# Patient Record
Sex: Male | Born: 1940 | Race: White | Hispanic: No | Marital: Married | Smoking: Former smoker
Health system: Southern US, Community
[De-identification: ages and names within clinical notes are randomized; demographics above are authoritative.]

## PROBLEM LIST (undated history)

## (undated) DIAGNOSIS — G4730 Sleep apnea, unspecified: Secondary | ICD-10-CM

## (undated) DIAGNOSIS — J45909 Unspecified asthma, uncomplicated: Secondary | ICD-10-CM

## (undated) DIAGNOSIS — E119 Type 2 diabetes mellitus without complications: Secondary | ICD-10-CM

## (undated) DIAGNOSIS — J449 Chronic obstructive pulmonary disease, unspecified: Secondary | ICD-10-CM

## (undated) HISTORY — PX: REPLACEMENT TOTAL KNEE BILATERAL: SUR1225

## (undated) HISTORY — PX: OTHER SURGICAL HISTORY: SHX169

## (undated) HISTORY — PX: VASECTOMY: SHX75

## (undated) HISTORY — PX: BACK SURGERY: SHX140

## (undated) HISTORY — PX: KNEE SURGERY: SHX244

## (undated) HISTORY — PX: TONSILLECTOMY: SUR1361

---

## 1998-04-24 ENCOUNTER — Ambulatory Visit (HOSPITAL_BASED_OUTPATIENT_CLINIC_OR_DEPARTMENT_OTHER): Admission: RE | Admit: 1998-04-24 | Discharge: 1998-04-24 | Payer: Self-pay | Admitting: Orthopaedic Surgery

## 1999-06-28 ENCOUNTER — Emergency Department (HOSPITAL_COMMUNITY): Admission: EM | Admit: 1999-06-28 | Discharge: 1999-06-28 | Payer: Self-pay | Admitting: Internal Medicine

## 1999-06-28 ENCOUNTER — Encounter: Payer: Self-pay | Admitting: Internal Medicine

## 1999-07-01 ENCOUNTER — Encounter: Admission: RE | Admit: 1999-07-01 | Discharge: 1999-09-29 | Payer: Self-pay | Admitting: Family Medicine

## 1999-08-28 ENCOUNTER — Encounter: Admission: RE | Admit: 1999-08-28 | Discharge: 1999-11-26 | Payer: Self-pay | Admitting: Internal Medicine

## 1999-12-16 ENCOUNTER — Encounter: Admission: RE | Admit: 1999-12-16 | Discharge: 2000-03-15 | Payer: Self-pay | Admitting: Internal Medicine

## 2000-04-08 ENCOUNTER — Encounter: Admission: RE | Admit: 2000-04-08 | Discharge: 2000-07-07 | Payer: Self-pay | Admitting: Internal Medicine

## 2000-08-19 ENCOUNTER — Emergency Department (HOSPITAL_COMMUNITY): Admission: EM | Admit: 2000-08-19 | Discharge: 2000-08-19 | Payer: Self-pay | Admitting: Emergency Medicine

## 2000-08-19 ENCOUNTER — Encounter: Payer: Self-pay | Admitting: Emergency Medicine

## 2001-08-20 ENCOUNTER — Encounter: Payer: Self-pay | Admitting: Orthopaedic Surgery

## 2001-08-24 ENCOUNTER — Inpatient Hospital Stay (HOSPITAL_COMMUNITY): Admission: RE | Admit: 2001-08-24 | Discharge: 2001-08-28 | Payer: Self-pay | Admitting: Orthopaedic Surgery

## 2001-08-25 ENCOUNTER — Encounter: Payer: Self-pay | Admitting: Internal Medicine

## 2002-02-15 ENCOUNTER — Ambulatory Visit (HOSPITAL_BASED_OUTPATIENT_CLINIC_OR_DEPARTMENT_OTHER): Admission: RE | Admit: 2002-02-15 | Discharge: 2002-02-15 | Payer: Self-pay | Admitting: Family Medicine

## 2002-12-15 ENCOUNTER — Encounter: Payer: Self-pay | Admitting: Vascular Surgery

## 2002-12-19 ENCOUNTER — Ambulatory Visit (HOSPITAL_COMMUNITY): Admission: RE | Admit: 2002-12-19 | Discharge: 2002-12-19 | Payer: Self-pay | Admitting: Vascular Surgery

## 2002-12-19 ENCOUNTER — Encounter (INDEPENDENT_AMBULATORY_CARE_PROVIDER_SITE_OTHER): Payer: Self-pay | Admitting: *Deleted

## 2006-08-31 ENCOUNTER — Ambulatory Visit: Payer: Self-pay | Admitting: Internal Medicine

## 2007-06-26 DIAGNOSIS — IMO0002 Reserved for concepts with insufficient information to code with codable children: Secondary | ICD-10-CM | POA: Insufficient documentation

## 2007-06-26 DIAGNOSIS — E1165 Type 2 diabetes mellitus with hyperglycemia: Secondary | ICD-10-CM

## 2007-06-26 DIAGNOSIS — G4733 Obstructive sleep apnea (adult) (pediatric): Secondary | ICD-10-CM

## 2007-06-26 DIAGNOSIS — J310 Chronic rhinitis: Secondary | ICD-10-CM

## 2007-06-26 DIAGNOSIS — J209 Acute bronchitis, unspecified: Secondary | ICD-10-CM | POA: Insufficient documentation

## 2007-10-15 ENCOUNTER — Encounter: Payer: Self-pay | Admitting: Internal Medicine

## 2007-10-29 ENCOUNTER — Ambulatory Visit: Payer: Self-pay | Admitting: Internal Medicine

## 2008-10-03 ENCOUNTER — Encounter: Admission: RE | Admit: 2008-10-03 | Discharge: 2008-10-03 | Payer: Self-pay | Admitting: Family Medicine

## 2008-11-08 ENCOUNTER — Ambulatory Visit: Payer: Self-pay | Admitting: Internal Medicine

## 2009-10-25 ENCOUNTER — Encounter: Admission: RE | Admit: 2009-10-25 | Discharge: 2009-10-25 | Payer: Self-pay | Admitting: Family Medicine

## 2010-03-07 ENCOUNTER — Inpatient Hospital Stay (HOSPITAL_COMMUNITY): Admission: RE | Admit: 2010-03-07 | Discharge: 2010-03-10 | Payer: Self-pay | Admitting: Orthopaedic Surgery

## 2010-12-01 LAB — HEMOGLOBIN A1C: Hgb A1c MFr Bld: 6.8 % — ABNORMAL HIGH (ref <5.7)

## 2010-12-01 LAB — BASIC METABOLIC PANEL
BUN: 12 mg/dL (ref 6–23)
BUN: 14 mg/dL (ref 6–23)
CO2: 26 mEq/L (ref 19–32)
CO2: 29 mEq/L (ref 19–32)
Calcium: 8.4 mg/dL (ref 8.4–10.5)
Chloride: 101 mEq/L (ref 96–112)
Chloride: 97 mEq/L (ref 96–112)
GFR calc Af Amer: >60 mL/min (ref >60)
GFR calc non Af Amer: >60 mL/min (ref >60)
Glucose, Bld: 165 mg/dL — ABNORMAL HIGH (ref 70–99)
Glucose, Bld: 178 mg/dL — ABNORMAL HIGH (ref 70–99)
Potassium: 4.3 mEq/L (ref 3.5–5.1)
Potassium: 4.6 mEq/L (ref 3.5–5.1)
Sodium: 135 mEq/L (ref 135–145)
Sodium: 137 mEq/L (ref 135–145)

## 2010-12-01 LAB — APTT: aPTT: 25 seconds (ref 24–37)

## 2010-12-01 LAB — CBC
HCT: 36.7 % — ABNORMAL LOW (ref 39.0–52.0)
HCT: 40.1 % (ref 39.0–52.0)
Hemoglobin: 13.6 g/dL (ref 13.0–17.0)
MCH: 33.9 pg (ref 26.0–34.0)
MCH: 34 pg (ref 26.0–34.0)
MCHC: 33.7 g/dL (ref 30.0–36.0)
MCHC: 33.8 g/dL (ref 30.0–36.0)
MCHC: 34.3 g/dL (ref 30.0–36.0)
MCV: 100.1 fL — ABNORMAL HIGH (ref 78.0–100.0)
MCV: 100.4 fL — ABNORMAL HIGH (ref 78.0–100.0)
Platelets: 141 10*3/uL — ABNORMAL LOW (ref 150–400)
Platelets: 156 10*3/uL (ref 150–400)
RBC: 3.99 MIL/uL — ABNORMAL LOW (ref 4.22–5.81)
RDW: 13.1 % (ref 11.5–15.5)
RDW: 13.3 % (ref 11.5–15.5)
RDW: 13.7 % (ref 11.5–15.5)
WBC: 9.3 10*3/uL (ref 4.0–10.5)

## 2010-12-01 LAB — DIFFERENTIAL
Basophils Relative: 1 % (ref 0–1)
Lymphs Abs: 2.3 10*3/uL (ref 0.7–4.0)
Monocytes Absolute: 0.9 10*3/uL (ref 0.1–1.0)
Monocytes Relative: 10 % (ref 3–12)
Neutro Abs: 5 10*3/uL (ref 1.7–7.7)
Neutrophils Relative %: 58 % (ref 43–77)

## 2010-12-01 LAB — GLUCOSE, CAPILLARY
Glucose-Capillary: 162 mg/dL — ABNORMAL HIGH (ref 70–99)
Glucose-Capillary: 183 mg/dL — ABNORMAL HIGH (ref 70–99)
Glucose-Capillary: 185 mg/dL — ABNORMAL HIGH (ref 70–99)
Glucose-Capillary: 202 mg/dL — ABNORMAL HIGH (ref 70–99)
Glucose-Capillary: 209 mg/dL — ABNORMAL HIGH (ref 70–99)
Glucose-Capillary: 225 mg/dL — ABNORMAL HIGH (ref 70–99)
Glucose-Capillary: 261 mg/dL — ABNORMAL HIGH (ref 70–99)

## 2010-12-01 LAB — URINALYSIS, ROUTINE W REFLEX MICROSCOPIC
Leukocytes, UA: NEGATIVE
Nitrite: NEGATIVE
Specific Gravity, Urine: 1.014 (ref 1.005–1.030)
Urobilinogen, UA: 0.2 mg/dL (ref 0.0–1.0)
pH: 5.5 (ref 5.0–8.0)

## 2010-12-01 LAB — SURGICAL PCR SCREEN
MRSA, PCR: NEGATIVE
Staphylococcus aureus: NEGATIVE

## 2010-12-01 LAB — URINE MICROSCOPIC-ADD ON

## 2010-12-01 LAB — COMPREHENSIVE METABOLIC PANEL
ALT: 52 U/L (ref 0–53)
Albumin: 4 g/dL (ref 3.5–5.2)
Alkaline Phosphatase: 81 U/L (ref 39–117)
Calcium: 9.7 mg/dL (ref 8.4–10.5)
Glucose, Bld: 83 mg/dL (ref 70–99)
Potassium: 4.2 mEq/L (ref 3.5–5.1)
Sodium: 137 mEq/L (ref 135–145)
Total Protein: 8.3 g/dL (ref 6.0–8.3)

## 2010-12-01 LAB — PROTIME-INR
INR: 0.95 (ref 0.00–1.49)
INR: 1.09 (ref 0.00–1.49)
INR: 2.06 — ABNORMAL HIGH (ref 0.00–1.49)
Prothrombin Time: 23 seconds — ABNORMAL HIGH (ref 11.6–15.2)

## 2011-01-31 NOTE — Op Note (Signed)
[place]. [facility]  Patient:    Joe Palmer, Joe Palmer Visit Number: [id number] MRN: [id number]          Service Type: SUR Location: [id number] Attending Physician:  Marcene Corning Dictated by:   Lubertha Basque. Jerl Santos, M.D. Proc. Date: 08/24/01 Admit Date:  08/24/2001                             Operative Report  PREOPERATIVE DIAGNOSIS:  Left knee degenerative joint disease.  POSTOPERATIVE DIAGNOSIS:  Left knee degenerative joint disease.  OPERATION PERFORMED:  Left knee total knee replacement.  ANESTHESIA:  General.  ATTENDING SURGEON:  Lubertha Basque. Jerl Santos, M.D.  ASSISTANT: 1. Jearld Adjutant, M.D. 2. Lindwood Qua, P.A.  INDICATIONS FOR PROCEDURE:  The patient is a 70 year old man with a long history of bilateral knee pain.  On the left he has end stage degenerative arthritis on x-ray.  He has failed various oral anti-inflammatories as well as injections of cortisone and Synvisc.  He has disabling pain and was offered a knee replacement operation.  The procedure was discussed with the patient and informed operative consent was obtained after discussion of possible complications of reaction to anesthesia, infection, DVT, PE, and death.  DESCRIPTION OF PROCEDURE:  The patient was taken to an operating suite where general anesthetic was applied without difficulty.  He was then positioned supine and prepped and draped in normal sterile fashion.  After the administration of preop intravenous antibiotics, the left leg was elevated, exsanguinated, tourniquet inflated about the thigh.  All appropriate anti-infective measures were used including intravenous antibiotics, Betadine impregnated drapes, and closed hooded exhaust systems for each member of the surgical team.  A longitudinal incision was made on the anterior aspect of the knee with dissection down to the extensor mechanism.  A medial parapatellar incision was made in this structure.  The knee  cap was flipped and the knee flexed.  He had severe degenerative arthritis in all three compartments. There was no ACL, but the PCL was intact.  He had fair bone quality.  A tibial cut was made with an extramedullary guide parallel with the floor.  The femur sized to a size large component and the appropriate guide was placed and used to make anterior and posterior cuts creating a flexion gap of 10 mm.  He was left slightly loose.  A release was done on the medial aspect where he had some tight structures.  The anterior cut on the femur did leave a slight notch in the [facility].  An extension guide was then placed in the femur and this was utilized to make a distal cut creating a 10 mm balanced extension gap.  The Chamfer cutting guide was placed on the femur and used to create the large size large Chamfer cuts.  The tibia sized to a size 5 component and the appropriate guide was placed and used to prepare for a keeled component. Patella sized to a large and appropriate guide was placed and cut this down to thickness of 27 to 15 mm and then the appropriate drill holes were made.  A trial reduction was performed with all the aforementioned components and he easily came to slight hyperextension and flexed past 120 with a patella that tracked well.  All trial components were removed.  Pulsatile lavage was used to cleanse all bony surfaces followed by mixing the cement with antibiotic. The cement was then pressurized on all  three cut bony surfaces and the Depuy LCS components were placed.  Again these were a size large femur, large patella and 5 tibia with a 10 mm deep dish spacer.  Pressure was held on all the components until the cement had hardened.  Excess cement was trimmed.  The knee again ranged showing the patella tracked well without a lateral release. The tourniquet was deflated and a mild amount of bleeding was easily controlled with Bovie cautery.  A drain was placed exiting  superolaterally. The extensor mechanism was prepared with #1 Vicryl in interrupted fashion. The subcutaneous tissues were reapproximated with 0 and 2-0 undyed Vicryl followed by skin closure with staples.  Adaptic was applied to the wound followed by dry gauze and a loose Ace wrap.  Estimated blood loss and intraoperative fluids can be obtained from Anesthesia records as can accurate tourniquet time which was just over an hour.  DISPOSITION:  The patient was extubated in the operating room and taken to the recovery room in stable condition.  Plans were for him to be admitted to the orthopedic surgery service for appropriate postoperative care to include perioperative antibiotics and Coumadin for deep vein thrombosis prophylaxis. Dictated by:   Lubertha Basque Jerl Santos, M.D. Attending Physician:  Marcene Corning DD:  08/24/01 TD:  08/24/01 Job: [id number]

## 2011-01-31 NOTE — Assessment & Plan Note (Signed)
[facility]                             PULMONARY OFFICE NOTE   NAME:Bass, JAREL CUADRA                     MRN:          [id number]  DATE:08/31/2006                            DOB:          01-08-1941    PROBLEM:  1. Obstructive sleep apnea.  2. Asthmatic bronchitis.  3. Rhinitis.  4. Diabetes.   HISTORY:  First visit at this office for Mr. Dusza, previously followed  at [facility].  He has continued CPAP at 12 CWP,  used every night, but old machine is worn and needs replacement.  Humidifier also is insufficient on the older machine.  He is having  increased nasal congestion treated with a decongestant nasal spray and  also Nasacort AQ.   MEDICATIONS:  1. Metformin 860 mg.  2. Januvia 100 mg.  3. Glipizide XL 10 mg.  4. Advair 100/50.  5. Nasacort AQ.  6. Tramadol.  7. Doxycycline before dental work.   ALLERGIES:  NO MEDICATION ALLERGY.   OBJECTIVE:  Weight 279 pounds, compared with 276 pounds in 2005.  Blood  pressure 136/90, pulse regular 75, room air saturation 94%.  He is obese, alert, husky voice quality without stridor or pharyngeal  erythema.  There is end expiratory wheeze, unlabored.  Heart sounds regular without murmur or gallop, no edema.   IMPRESSION:  Obstructive apnea.  Pressure setting is probably okay, but  the machine is worn and needs replacement.  His chronic asthmatic  bronchitis may not be optimally controlled, but he implies that current  meds are adequate.   PLAN:  1. Nasal saline gel, replacement CPAP set at 12 CWP.  2. Medication talk done.  Options considered.  3. Weight loss encouraged.  4. Sleep hygiene reviewed.  5. Schedule return in 1 year, earlier p.r.n.     Clinton D. Maple Hudson, MD, Tonny Bollman, FACP  Electronically Signed    CDY/MedQ  DD: 09/04/2006  DT: 09/05/2006  Job #: [id number]   cc:   Gretta Arab. Valentina Lucks, M.D.

## 2011-01-31 NOTE — Op Note (Signed)
NAME:  Joe Palmer, Joe Palmer                        ACCOUNT NO.:  [id number]   MEDICAL RECORD NO.:  [id number]                   PATIENT TYPE:  OIB   LOCATION:  2899                                 FACILITY:  [facility]   PHYSICIAN:  Larina Earthly, M.D.                 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   PROCEDURE:  Ligation and stripping of the right lesser saphenous vein and  removal of tributary varicosities with stab-avulsion technique.   SURGEON:  Larina Earthly, M.D.   ANESTHESIA:  General endotracheal.   COMPLICATIONS:  None.   DISPOSITION:  To recovery room stable.   DESCRIPTION OF PROCEDURE:  The patient was taken to the operating room and  placed in the supine position, where the area of the right leg was prepped  and draped in the usual sterile fashion.  Using an incision at the level of  the ankle, the lesser saphenous vein was identified at the ankle.  The  patient's lesser saphenous and tributary varicosities had been marked  preoperatively with the patient standing.  The lesser saphenous vein was  ligated at the level of the ankle and was controlled proximally.  The vein  was opened with an 11 blade and a stripper was passed up to the level of the  popliteal space.  A separate incision was made at the level of the popliteal  space and the lesser saphenous vein was identified as it entered the  popliteal fossa.  The vein was ligated proximally and was controlled  distally with a 2-0 silk Potts tie.  The vein was open at this level, and  the stripper head was passed out.  Next with several separate stab  incisions, the remaining tributary varicosities in the popliteal space  and  the posterior calf were removed with stab-avulsion technique.  Hemostasis  was obtained with pressure.  The lesser saphenous vein was then stripped  from the popliteal space to the ankle and pressure again was held for  hemostasis.  The wounds were irrigated with saline.  The wounds were closed  with 4-0 Vicryl in the subcuticular tissue.  Benzoin and Steri-Strips were  applied.  The patient's leg was dressed with Kerlix and 4 x 4's over the  incisions and a Coban pressure dressing was placed.  The patient tolerated  the procedure without immediate complication and was transferred to the  holding area in stable condition.  Larina Earthly, M.D.    TFE/MEDQ  D:  12/19/2002  T:  12/19/2002  Job:  [id number]

## 2011-01-31 NOTE — Discharge Summary (Signed)
[place]. [facility]  Patient:    Joe Palmer, Joe Palmer Visit Number: [id number] MRN: [id number]          Service Type: SUR Location: 5000 5033 01 Attending Physician:  Marcene Corning Dictated by:   Prince Rome, P.A. Admit Date:  08/24/2001 Discharge Date: 08/28/2001                             Discharge Summary  ADMISSION DIAGNOSES: 1. 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 2. Status post lumbar laminectomy. 3. Non-insulin-dependent diabetes mellitus. 4. Tobacco abuse. 5. Obstructive chronic bronchitis.  DISCHARGE DIAGNOSES: 1. 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 2. Status post lumbar laminectomy. 3. Non-insulin-dependent diabetes mellitus. 4. Tobacco abuse. 5. Obstructive chronic bronchitis.  PROCEDURES:  Total knee replacement left.  BRIEF HISTORY:  The patient is a patient well known to [place].  He is 70 years old.  He has had a knee arthroscopy and now is having pain with every step with his left knee and trouble sleeping at night time.  We have tried Syndisc injections, nonsteroidal anti-inflammatory drugs and corticosteroid injections.  We have discussed treatment options with the patient those being total knee replacement.  PERTINENT LABORATORY AND X-RAY FINDINGS: ELECTROCARDIOGRAM:  Sinus tachycardia.  CHEST X-RAY:  Chronic bronchitis.  INR last testing was 3.1.  CBC:  Hemoglobin 11.4.  OTHER INDICES:  Essentially normal and are inclusive in this chart.  HOSPITAL COURSE:  The patient was admitted postoperatively and placed on a variety of p.o. and IM analgesics for pain, intravenous Ancef 1 gram q.8h, PCA pump for pain control.  Dressings and drains were pulled the second day postoperatively.  Physical therapy was ordered for weight-bearing as tolerated.  He was having a difficult time with wheezing and we had started some incentive spirometry and then later moved to some nebulizer treatments. Then his  family doctor had a hospitalist that also held a consultation on the patient while he was in the hospital.  He was on low dose Coumadin, low dose protocol.  Physical therapy was helping with this and regulating to prevent deep venous thrombosis.  The patient progressed well with physical therapy to be weight-bearing as tolerated and was discharged home.  CONDITION ON DISCHARGE:  Improved.  FOLLOW UP:  Patient will remain on Coumadin for four weeks postoperatively and was given a prescription for pain medication. Patient has arrangements for home therapy and blood draws for his protime as he is on Coumadin were arranged.  The patient will return to the office within seven to ten days. Dictated by:   Prince Rome, P.A. Attending Physician:  Marcene Corning DD:  09/27/01 TD:  09/27/01 Job: [id number]

## 2011-03-24 ENCOUNTER — Other Ambulatory Visit: Payer: Self-pay | Admitting: Dermatology

## 2011-09-29 ENCOUNTER — Other Ambulatory Visit: Payer: Self-pay | Admitting: Dermatology

## 2012-04-05 ENCOUNTER — Other Ambulatory Visit: Payer: Self-pay | Admitting: Dermatology

## 2013-06-07 ENCOUNTER — Ambulatory Visit
Admission: RE | Admit: 2013-06-07 | Discharge: 2013-06-07 | Disposition: A | Discharge status: Home / Self Care | Payer: Medicare Other | Source: Ambulatory Visit | Attending: Family Medicine | Admitting: Family Medicine

## 2013-06-07 ENCOUNTER — Other Ambulatory Visit: Payer: Self-pay | Admitting: Family Medicine

## 2013-06-07 DIAGNOSIS — R0989 Other specified symptoms and signs involving the circulatory and respiratory systems: Secondary | ICD-10-CM

## 2013-06-07 DIAGNOSIS — R0600 Dyspnea, unspecified: Secondary | ICD-10-CM

## 2013-12-05 ENCOUNTER — Emergency Department (HOSPITAL_COMMUNITY): Payer: Medicare Other

## 2013-12-05 ENCOUNTER — Encounter (HOSPITAL_COMMUNITY): Payer: Self-pay | Admitting: Emergency Medicine

## 2013-12-05 ENCOUNTER — Inpatient Hospital Stay (HOSPITAL_COMMUNITY)
Admission: EM | Admit: 2013-12-05 | Discharge: 2013-12-08 | DRG: 190 | Disposition: A | Discharge status: Home / Self Care | Payer: Medicare Other | Attending: Internal Medicine | Admitting: Internal Medicine

## 2013-12-05 DIAGNOSIS — J209 Acute bronchitis, unspecified: Secondary | ICD-10-CM

## 2013-12-05 DIAGNOSIS — IMO0002 Reserved for concepts with insufficient information to code with codable children: Secondary | ICD-10-CM | POA: Diagnosis present

## 2013-12-05 DIAGNOSIS — Z6841 Body Mass Index (BMI) 40.0 and over, adult: Secondary | ICD-10-CM

## 2013-12-05 DIAGNOSIS — R0902 Hypoxemia: Secondary | ICD-10-CM | POA: Diagnosis present

## 2013-12-05 DIAGNOSIS — G4733 Obstructive sleep apnea (adult) (pediatric): Secondary | ICD-10-CM

## 2013-12-05 DIAGNOSIS — Z96659 Presence of unspecified artificial knee joint: Secondary | ICD-10-CM

## 2013-12-05 DIAGNOSIS — Z91199 Patient's noncompliance with other medical treatment and regimen due to unspecified reason: Secondary | ICD-10-CM

## 2013-12-05 DIAGNOSIS — J45901 Unspecified asthma with (acute) exacerbation: Principal | ICD-10-CM

## 2013-12-05 DIAGNOSIS — J9600 Acute respiratory failure, unspecified whether with hypoxia or hypercapnia: Secondary | ICD-10-CM | POA: Diagnosis present

## 2013-12-05 DIAGNOSIS — J449 Chronic obstructive pulmonary disease, unspecified: Secondary | ICD-10-CM | POA: Insufficient documentation

## 2013-12-05 DIAGNOSIS — Z79899 Other long term (current) drug therapy: Secondary | ICD-10-CM

## 2013-12-05 DIAGNOSIS — IMO0001 Reserved for inherently not codable concepts without codable children: Secondary | ICD-10-CM | POA: Diagnosis present

## 2013-12-05 DIAGNOSIS — Z87891 Personal history of nicotine dependence: Secondary | ICD-10-CM

## 2013-12-05 DIAGNOSIS — Z9119 Patient's noncompliance with other medical treatment and regimen: Secondary | ICD-10-CM

## 2013-12-05 DIAGNOSIS — J441 Chronic obstructive pulmonary disease with (acute) exacerbation: Principal | ICD-10-CM | POA: Diagnosis present

## 2013-12-05 DIAGNOSIS — Z882 Allergy status to sulfonamides status: Secondary | ICD-10-CM

## 2013-12-05 DIAGNOSIS — J310 Chronic rhinitis: Secondary | ICD-10-CM

## 2013-12-05 DIAGNOSIS — E1165 Type 2 diabetes mellitus with hyperglycemia: Secondary | ICD-10-CM | POA: Diagnosis present

## 2013-12-05 HISTORY — DX: Type 2 diabetes mellitus without complications: E11.9

## 2013-12-05 HISTORY — DX: Unspecified asthma, uncomplicated: J45.909

## 2013-12-05 HISTORY — DX: Sleep apnea, unspecified: G47.30

## 2013-12-05 HISTORY — DX: Chronic obstructive pulmonary disease, unspecified: J44.9

## 2013-12-05 LAB — I-STAT TROPONIN, ED: TROPONIN I, POC: 0 ng/mL (ref 0.00–0.08)

## 2013-12-05 LAB — COMPREHENSIVE METABOLIC PANEL
ALBUMIN: 3.5 g/dL (ref 3.5–5.2)
ALK PHOS: 110 U/L (ref 39–117)
ALT: 23 U/L (ref 0–53)
AST: 25 U/L (ref 0–37)
BILIRUBIN TOTAL: 0.4 mg/dL (ref 0.3–1.2)
BUN: 23 mg/dL (ref 6–23)
CHLORIDE: 101 meq/L (ref 96–112)
CO2: 25 mEq/L (ref 19–32)
Calcium: 9.4 mg/dL (ref 8.4–10.5)
Creatinine, Ser: 0.85 mg/dL (ref 0.50–1.35)
GFR calc non Af Amer: 83 mL/min — ABNORMAL LOW (ref >90)
GLUCOSE: 170 mg/dL — AB (ref 70–99)
POTASSIUM: 4.2 meq/L (ref 3.7–5.3)
SODIUM: 140 meq/L (ref 137–147)
TOTAL PROTEIN: 7.9 g/dL (ref 6.0–8.3)

## 2013-12-05 LAB — CREATININE, SERUM
Creatinine, Ser: 0.91 mg/dL (ref 0.50–1.35)
GFR calc Af Amer: >90 mL/min (ref >90)
GFR calc non Af Amer: 81 mL/min — ABNORMAL LOW (ref >90)

## 2013-12-05 LAB — CBC
HEMATOCRIT: 45.2 % (ref 39.0–52.0)
HEMOGLOBIN: 15.8 g/dL (ref 13.0–17.0)
MCH: 34.1 pg — AB (ref 26.0–34.0)
MCHC: 35 g/dL (ref 30.0–36.0)
MCV: 97.6 fL (ref 78.0–100.0)
Platelets: 143 10*3/uL — ABNORMAL LOW (ref 150–400)
RBC: 4.63 MIL/uL (ref 4.22–5.81)
RDW: 13.7 % (ref 11.5–15.5)
WBC: 9.1 10*3/uL (ref 4.0–10.5)

## 2013-12-05 LAB — I-STAT ARTERIAL BLOOD GAS, ED
BICARBONATE: 25.2 meq/L — AB (ref 20.0–24.0)
O2 SAT: 93 %
PH ART: 7.385 (ref 7.350–7.450)
Patient temperature: 98.6
TCO2: 26 mmol/L (ref 0–100)
pCO2 arterial: 42.1 mmHg (ref 35.0–45.0)
pO2, Arterial: 68 mmHg — ABNORMAL LOW (ref 80.0–100.0)

## 2013-12-05 LAB — CBC WITH DIFFERENTIAL/PLATELET
Basophils Absolute: 0 10*3/uL (ref 0.0–0.1)
Basophils Relative: 0 % (ref 0–1)
EOS ABS: 0.5 10*3/uL (ref 0.0–0.7)
Eosinophils Relative: 5 % (ref 0–5)
HCT: 46.8 % (ref 39.0–52.0)
HEMOGLOBIN: 16 g/dL (ref 13.0–17.0)
LYMPHS ABS: 2.1 10*3/uL (ref 0.7–4.0)
Lymphocytes Relative: 24 % (ref 12–46)
MCH: 33.6 pg (ref 26.0–34.0)
MCHC: 34.2 g/dL (ref 30.0–36.0)
MCV: 98.3 fL (ref 78.0–100.0)
MONOS PCT: 8 % (ref 3–12)
Monocytes Absolute: 0.7 10*3/uL (ref 0.1–1.0)
NEUTROS ABS: 5.7 10*3/uL (ref 1.7–7.7)
NEUTROS PCT: 63 % (ref 43–77)
PLATELETS: 154 10*3/uL (ref 150–400)
RBC: 4.76 MIL/uL (ref 4.22–5.81)
RDW: 13.6 % (ref 11.5–15.5)
WBC: 9 10*3/uL (ref 4.0–10.5)

## 2013-12-05 LAB — D-DIMER, QUANTITATIVE: D-Dimer, Quant: 1.16 ug/mL-FEU — ABNORMAL HIGH (ref 0.00–0.48)

## 2013-12-05 LAB — PRO B NATRIURETIC PEPTIDE: Pro B Natriuretic peptide (BNP): 44.8 pg/mL (ref 0–450)

## 2013-12-05 MED ORDER — ALBUTEROL SULFATE (2.5 MG/3ML) 0.083% IN NEBU
5.0000 mg | INHALATION_SOLUTION | RESPIRATORY_TRACT | Status: DC
  Administered 2013-12-05 – 2013-12-06 (×4): 5 mg via RESPIRATORY_TRACT
  Filled 2013-12-05 (×3): qty 6

## 2013-12-05 MED ORDER — METHYLPREDNISOLONE SODIUM SUCC 125 MG IJ SOLR
125.0000 mg | Freq: Once | INTRAMUSCULAR | Status: AC
  Administered 2013-12-05: 125 mg via INTRAVENOUS
  Filled 2013-12-05: qty 2

## 2013-12-05 MED ORDER — ALBUTEROL SULFATE (2.5 MG/3ML) 0.083% IN NEBU: 5.0000 mg | INHALATION_SOLUTION | RESPIRATORY_TRACT | Status: DC | PRN

## 2013-12-05 MED ORDER — SODIUM CHLORIDE 0.9 % IV SOLN
INTRAVENOUS | Status: DC
  Administered 2013-12-05: 22:00:00 via INTRAVENOUS

## 2013-12-05 MED ORDER — TIOTROPIUM BROMIDE MONOHYDRATE 18 MCG IN CAPS
1.0000 | ORAL_CAPSULE | Freq: Every day | RESPIRATORY_TRACT | Status: DC
  Administered 2013-12-06: 18 ug via RESPIRATORY_TRACT
  Filled 2013-12-05: qty 5

## 2013-12-05 MED ORDER — HEPARIN SODIUM (PORCINE) 5000 UNIT/ML IJ SOLN
5000.0000 [IU] | Freq: Three times a day (TID) | INTRAMUSCULAR | Status: DC
  Administered 2013-12-05 – 2013-12-08 (×8): 5000 [IU] via SUBCUTANEOUS
  Filled 2013-12-05 (×11): qty 1

## 2013-12-05 MED ORDER — OXYCODONE-ACETAMINOPHEN 10-325 MG PO TABS: 1.0000 | ORAL_TABLET | Freq: Four times a day (QID) | ORAL | Status: DC | PRN

## 2013-12-05 MED ORDER — LORATADINE 10 MG PO TABS
10.0000 mg | ORAL_TABLET | Freq: Every day | ORAL | Status: DC
  Administered 2013-12-06 – 2013-12-08 (×3): 10 mg via ORAL
  Filled 2013-12-05 (×3): qty 1

## 2013-12-05 MED ORDER — DOXYCYCLINE HYCLATE 100 MG IV SOLR
100.0000 mg | Freq: Two times a day (BID) | INTRAVENOUS | Status: DC
  Administered 2013-12-05 – 2013-12-08 (×6): 100 mg via INTRAVENOUS
  Filled 2013-12-05 (×8): qty 100

## 2013-12-05 MED ORDER — INSULIN ASPART 100 UNIT/ML ~~LOC~~ SOLN
0.0000 [IU] | SUBCUTANEOUS | Status: DC
  Administered 2013-12-06 (×3): 8 [IU] via SUBCUTANEOUS
  Administered 2013-12-06: 15 [IU] via SUBCUTANEOUS
  Administered 2013-12-06 (×2): 11 [IU] via SUBCUTANEOUS
  Administered 2013-12-07: 8 [IU] via SUBCUTANEOUS
  Administered 2013-12-07: 15 [IU] via SUBCUTANEOUS
  Administered 2013-12-07: 3 [IU] via SUBCUTANEOUS

## 2013-12-05 MED ORDER — ALBUTEROL (5 MG/ML) CONTINUOUS INHALATION SOLN
10.0000 mg/h | INHALATION_SOLUTION | Freq: Once | RESPIRATORY_TRACT | Status: AC
  Administered 2013-12-05: 10 mg/h via RESPIRATORY_TRACT
  Filled 2013-12-05: qty 20

## 2013-12-05 MED ORDER — INSULIN GLARGINE 100 UNIT/ML SOLOSTAR PEN: 40.0000 [IU] | PEN_INJECTOR | Freq: Every day | SUBCUTANEOUS | Status: DC

## 2013-12-05 MED ORDER — METHYLPREDNISOLONE SODIUM SUCC 125 MG IJ SOLR
125.0000 mg | Freq: Four times a day (QID) | INTRAMUSCULAR | Status: DC
  Administered 2013-12-05 – 2013-12-06 (×3): 125 mg via INTRAVENOUS
  Filled 2013-12-05 (×6): qty 2

## 2013-12-05 NOTE — H&P (Signed)
Hospitalist Admission History and Physical  Patient name: Joe Palmer Medical record number: [id number] Date of birth: 06-30-1941 Age: 73 y.o. Gender: male  Primary Care Provider: Astrid DivineGRIFFIN,ELAINE COLLINS, MD  Chief Complaint: resp distress, COPD exacerbation  History of Present Illness:This is a 73 y.o. year old male with significant past medical history of COPD/asthma, OSA presenting with resp distress, hypoxia, COPD exacerbation. Has had worsening allergic sxs over past 3-4 days. Has had progressive wheezing and increased WOB. No cough or productive sputum. No fever or chills. No chest pain.  Was seen by PCP early today. Noted to be persistent hypoxic into mid 80s. Was given neb x1 with O2 sats going into upper 80s/low 90s. Was brought to ER 2/2 persistent resp distress and hypoxia.  In ER, CXR negative for PNA. Trop and pro BNP also WNL. Started on CAT as well as solumedrol, still with O2 sats in low 90s/upper 80s despite supplemental O2. Denies previous O2 use in the past.     Patient Active Problem List   Diagnosis Date Noted  . Decompensated COPD with exacerbation (chronic obstructive pulmonary disease) 12/05/2013  . DM 06/26/2007  . OBSTRUCTIVE SLEEP APNEA 06/26/2007  . ASTHMATIC BRONCHITIS, ACUTE 06/26/2007  . RHINITIS 06/26/2007   Past Medical History: Past Medical History  Diagnosis Date  . Diabetes mellitus without complication   . Asthma   . Sleep apnea   . COPD (chronic obstructive pulmonary disease)     Past Surgical History: Past Surgical History  Procedure Laterality Date  . Tonsillectomy    . Vasectomy    . Back surgery    . Cataract surgery    . Knee surgery    . Replacement total knee bilateral      Social History: History   Social History  . Marital Status: Married    Spouse Name: N/A    Number of Children: N/A  . Years of Education: N/A   Social History Main Topics  . Smoking status: Former Smoker    Types: Cigarettes    Quit date:  09/15/2008  . Smokeless tobacco: None  . Alcohol Use: Yes     Comment: socially  . Drug Use: None  . Sexual Activity: None   Other Topics Concern  . None   Social History Narrative  . None    Family History: History reviewed. No pertinent family history.  Allergies: Allergies  Allergen Reactions  . Sulfa Antibiotics Other (See Comments)    unknown    Current Facility-Administered Medications  Medication Dose Route Frequency Provider Last Rate Last Dose  . 0.9 %  sodium chloride infusion   Intravenous Continuous Doree AlbeeSteven Newton, MD      . albuterol (PROVENTIL) (2.5 MG/3ML) 0.083% nebulizer solution 5 mg  5 mg Nebulization Q2H Doree AlbeeSteven Newton, MD      . albuterol (PROVENTIL) (2.5 MG/3ML) 0.083% nebulizer solution 5 mg  5 mg Nebulization Q2H PRN Doree AlbeeSteven Newton, MD      . doxycycline (VIBRAMYCIN) 100 mg in dextrose 5 % 250 mL IVPB  100 mg Intravenous Q12H Doree AlbeeSteven Newton, MD      . heparin injection 5,000 Units  5,000 Units Subcutaneous 3 times per day Doree AlbeeSteven Newton, MD      . insulin aspart (novoLOG) injection 0-15 Units  0-15 Units Subcutaneous 6 times per day Doree AlbeeSteven Newton, MD      . Insulin Glargine (LANTUS) Solostar Pen 40 Units  40 Units Subcutaneous QHS Doree AlbeeSteven Newton, MD      . loratadine [facility](CLARITIN)  tablet 10 mg  10 mg Oral Daily Doree Albee, MD      . methylPREDNISolone sodium succinate (SOLU-MEDROL) 125 mg/2 mL injection 125 mg  125 mg Intravenous Q6H Doree Albee, MD      . oxyCODONE-acetaminophen (PERCOCET) 10-325 MG per tablet 1 tablet  1 tablet Oral Q6H PRN Doree Albee, MD      . tiotropium [facility]) inhalation capsule 18 mcg  1 capsule Inhalation Daily Doree Albee, MD       Current Outpatient Prescriptions  Medication Sig Dispense Refill  . albuterol (PROVENTIL HFA;VENTOLIN HFA) 108 (90 BASE) MCG/ACT inhaler Inhale 2 puffs into the lungs every 6 (six) hours as needed for wheezing or shortness of breath.      Marland Kitchen albuterol (PROVENTIL) (2.5 MG/3ML) 0.083% nebulizer  solution Inhale 3 mLs into the lungs every 6 (six) hours as needed for wheezing or shortness of breath.       . cetirizine (ZYRTEC) 10 MG tablet Take 10 mg by mouth daily as needed for allergies.      . fluticasone (FLONASE) 50 MCG/ACT nasal spray Place 1 spray into both nostrils daily.      Marland Kitchen GLIPIZIDE XL 10 MG 24 hr tablet Take 10 mg by mouth daily.      Marland Kitchen ibuprofen (ADVIL,MOTRIN) 200 MG tablet Take 800 mg by mouth every 6 (six) hours as needed for moderate pain.      Marland Kitchen JANUVIA 100 MG tablet Take 100 mg by mouth daily.      Marland Kitchen LANTUS SOLOSTAR 100 UNIT/ML Solostar Pen Inject 40 Units into the skin at bedtime.      . metFORMIN (GLUCOPHAGE) 850 MG tablet Take 850-1,700 mg by mouth 2 (two) times daily. Takes 1 tab in am, then 2 tabs throughout the rest of the day      . oxyCODONE-acetaminophen (PERCOCET) 10-325 MG per tablet Take 1 tablet by mouth every 6 (six) hours as needed for pain.       Marland Kitchen SPIRIVA HANDIHALER 18 MCG inhalation capsule Place 1 capsule into inhaler and inhale daily.       Review Of Systems: 12 point ROS negative except as noted above in HPI.  Physical Exam: Filed Vitals:   12/05/13 2030  BP: 138/62  Pulse: 90  Temp:   Resp: 18    General: alert, cooperative and moderately obese HEENT: PERRLA and extra ocular movement intact Heart: S1, S2 normal, no murmur, rub or gallop, regular rate and rhythm Lungs: + diffuse inspiratory, expiratory wheezes  Abdomen: abdomen is soft without significant tenderness, masses, organomegaly or guarding Extremities: extremities normal, atraumatic, no cyanosis or edema Skin:no rashes, no ecchymoses Neurology: normal without focal findings and mental status, speech normal, alert and oriented x3  Labs and Imaging: Lab Results  Component Value Date/Time   NA 140 12/05/2013  6:29 PM   K 4.2 12/05/2013  6:29 PM   CL 101 12/05/2013  6:29 PM   CO2 25 12/05/2013  6:29 PM   BUN 23 12/05/2013  6:29 PM   CREATININE 0.85 12/05/2013  6:29 PM   GLUCOSE  170* 12/05/2013  6:29 PM   Lab Results  Component Value Date   WBC 9.0 12/05/2013   HGB 16.0 12/05/2013   HCT 46.8 12/05/2013   MCV 98.3 12/05/2013   PLT 154 12/05/2013    Dg Chest 2 View  12/05/2013   CLINICAL DATA:  Shortness of breath.  EXAM: CHEST  2 VIEW  COMPARISON:  DG CHEST 2 VIEW dated 06/07/2013; Varney Biles  CHEST 2 VIEW dated 03/05/2010  FINDINGS: Elevated right hemidiaphragm. Heart size within normal limits. Thoracic spondylosis. The lungs appear clear.  IMPRESSION: 1. Elevated right hemidiaphragm. 2. Thoracic spondylosis.   Electronically Signed   By: Herbie Baltimore M.D.   On: 12/05/2013 19:51     Assessment and Plan: Joe Palmer is a 73 y.o. year old male presenting with hypoxia, COPD exacerbation   Hypoxia: likely 2/2 COPD exacerbation. No chest pain currently. Wells score 1.5. Check d dimer. Start on solumedrol q6, albuterol nebs q1q2. Non rebreather.  Continue spiriva. Consider chest CT if hypoxia persists. ? If hypoxia is acute on chronic process. Check ABG to correlate. Bicarb WNL.   DM: SSI. A1C.  OSA: restart CPAP.  FEN/GI: Carb modified diet.  Prophylaxis: sub q heparin  Disposition: pending further evaluation  Code Status:Full Code       Doree Albee MD  Pager: [phone]

## 2013-12-05 NOTE — ED Notes (Signed)
Pt from home with c/o shortness of breath.  Pt sts he has been feeling sick since this Friday.  Pt went to his doctor's office today and was noted to be 84% on RA.  Pt given nebulizer treatment at doctor's office and O2 sats rose to 89% on RA.  Pt currently 88% on RA in triage.  Pt with hx of COPD and asthma; does not wear O2 at home.

## 2013-12-05 NOTE — ED Notes (Signed)
Pt removed from continuous neb and O2 dropped into the upper 80s. Placed back on 2 L nasal cannula. EDP aware. O2 91% on 2 L.

## 2013-12-05 NOTE — ED Provider Notes (Signed)
CSN: [id number]     Arrival date & time 12/05/13  1750 History   First MD Initiated Contact with Patient 12/05/13 1815     Chief Complaint  Patient presents with  . Shortness of Breath     (Consider location/radiation/quality/duration/timing/severity/associated sxs/prior Treatment) Patient is a 73 y.o. male presenting with shortness of breath. The history is provided by the patient and the spouse.  Shortness of Breath  patient here complaining of shortness of breath with nonproductive cough x3 days. Does have a history of COPD he went to his doctor's office today and pulse oximetry was 84% on room air. He was given an albuterol treatment and his O2 sat rose to 89%. Patient denies any fever or chills. No vomiting or diarrhea. No anginal type chest pain. No pleuritic chest pain. Denies any lower extremity swelling. Does not use oxygen at home. Still feels short of breath at this time.  Past Medical History  Diagnosis Date  . Diabetes mellitus without complication   . Asthma   . Sleep apnea   . COPD (chronic obstructive pulmonary disease)    Past Surgical History  Procedure Laterality Date  . Tonsillectomy    . Vasectomy    . Back surgery    . Cataract surgery    . Knee surgery    . Replacement total knee bilateral     History reviewed. No pertinent family history. History  Substance Use Topics  . Smoking status: Former Smoker    Types: Cigarettes    Quit date: 09/15/2008  . Smokeless tobacco: Not on file  . Alcohol Use: Yes     Comment: socially    Review of Systems  Respiratory: Positive for shortness of breath.   All other systems reviewed and are negative.      Allergies  Sulfa antibiotics  Home Medications   Current Outpatient Rx  Name  Route  Sig  Dispense  Refill  . albuterol (PROVENTIL HFA;VENTOLIN HFA) 108 (90 BASE) MCG/ACT inhaler   Inhalation   Inhale 2 puffs into the lungs every 6 (six) hours as needed for wheezing or shortness of breath.          Marland Kitchen albuterol (PROVENTIL) (2.5 MG/3ML) 0.083% nebulizer solution   Inhalation   Inhale 3 mLs into the lungs every 6 (six) hours as needed for wheezing or shortness of breath.          . cetirizine (ZYRTEC) 10 MG tablet   Oral   Take 10 mg by mouth daily as needed for allergies.         . fluticasone (FLONASE) 50 MCG/ACT nasal spray   Each Nare   Place 1 spray into both nostrils daily.         Marland Kitchen GLIPIZIDE XL 10 MG 24 hr tablet   Oral   Take 10 mg by mouth daily.         Marland Kitchen ibuprofen (ADVIL,MOTRIN) 200 MG tablet   Oral   Take 800 mg by mouth every 6 (six) hours as needed for moderate pain.         Marland Kitchen JANUVIA 100 MG tablet   Oral   Take 100 mg by mouth daily.         Marland Kitchen LANTUS SOLOSTAR 100 UNIT/ML Solostar Pen   Subcutaneous   Inject 40 Units into the skin at bedtime.         . metFORMIN (GLUCOPHAGE) 850 MG tablet   Oral   Take 850-1,700 mg by mouth 2 (two)  times daily. Takes 1 tab in am, then 2 tabs throughout the rest of the day         . oxyCODONE-acetaminophen (PERCOCET) 10-325 MG per tablet   Oral   Take 1 tablet by mouth every 6 (six) hours as needed for pain.          Marland Kitchen. SPIRIVA HANDIHALER 18 MCG inhalation capsule   Inhalation   Place 1 capsule into inhaler and inhale daily.          BP 151/80  Pulse 84  Temp(Src) 98.1 F (36.7 C) (Oral)  Resp 18  SpO2 91% Physical Exam  Nursing note and vitals reviewed. Constitutional: He is oriented to person, place, and time. He appears well-developed and well-nourished.  Non-toxic appearance. No distress.  HENT:  Head: Normocephalic and atraumatic.  Eyes: Conjunctivae, EOM and lids are normal. Pupils are equal, round, and reactive to light.  Neck: Normal range of motion. Neck supple. No tracheal deviation present. No mass present.  Cardiovascular: Normal rate, regular rhythm and normal heart sounds.  Exam reveals no gallop.   No murmur heard. Pulmonary/Chest: Effort normal. No stridor. No respiratory  distress. He has decreased breath sounds. He has wheezes. He has no rhonchi. He has no rales.  Abdominal: Soft. Normal appearance and bowel sounds are normal. He exhibits no distension. There is no tenderness. There is no rebound and no CVA tenderness.  Musculoskeletal: Normal range of motion. He exhibits no edema and no tenderness.  Neurological: He is alert and oriented to person, place, and time. He has normal strength. No cranial nerve deficit or sensory deficit. GCS eye subscore is 4. GCS verbal subscore is 5. GCS motor subscore is 6.  Skin: Skin is warm and dry. No abrasion and no rash noted.  Psychiatric: He has a normal mood and affect. His speech is normal and behavior is normal.    ED Course  Procedures (including critical care time) Labs Review Labs Reviewed  CBC WITH DIFFERENTIAL  COMPREHENSIVE METABOLIC PANEL  PRO B NATRIURETIC PEPTIDE  I-STAT TROPOININ, ED   Imaging Review No results found.   EKG Interpretation   Date/Time:  Monday December 05 2013 17:55:07 EDT Ventricular Rate:  87 PR Interval:  162 QRS Duration: 80 QT Interval:  356 QTC Calculation: 428 R Axis:   -39 Text Interpretation:  Sinus rhythm with marked sinus arrhythmia Left axis  deviation Low voltage QRS Cannot rule out Anterior infarct , age  undetermined Abnormal ECG Confirmed by Kayela Humphres  MD, Marigene Erler ([id number]) on  12/05/2013 6:44:49 PM      MDM   Final diagnoses:  None   Patient given albuterol 10 mg over one hour here along with Solu-Medrol IV. Wheezing remains. Pulse oximetry is and hit his exertion. Was placed on 2 L per nasal cannula. He is protecting his airway. He will be admitted for COPD exacerbation.    Toy BakerAnthony T Jonisha Kindig, MD 12/05/13 2117

## 2013-12-05 NOTE — ED Notes (Addendum)
Pts O2 88% on 2 L after getting up and moving from bed to chair. Hospitalist at bedside stated to place pt on NRB. Pt placed on NRB and O2 now 98%. Informed admitting MD of this and he stated that we could wean his O2 down with a target O2 sat of >92%.

## 2013-12-05 NOTE — ED Notes (Signed)
Pt placed on Venti mask and titrated up by O2 sats pt currently on 12 L with an FiO2 of 50% and pts O2 sats >92%.

## 2013-12-06 ENCOUNTER — Inpatient Hospital Stay (HOSPITAL_COMMUNITY): Payer: Medicare Other

## 2013-12-06 DIAGNOSIS — R0902 Hypoxemia: Secondary | ICD-10-CM | POA: Diagnosis present

## 2013-12-06 DIAGNOSIS — J449 Chronic obstructive pulmonary disease, unspecified: Secondary | ICD-10-CM

## 2013-12-06 DIAGNOSIS — J209 Acute bronchitis, unspecified: Secondary | ICD-10-CM

## 2013-12-06 DIAGNOSIS — J441 Chronic obstructive pulmonary disease with (acute) exacerbation: Secondary | ICD-10-CM

## 2013-12-06 DIAGNOSIS — J310 Chronic rhinitis: Secondary | ICD-10-CM

## 2013-12-06 DIAGNOSIS — IMO0001 Reserved for inherently not codable concepts without codable children: Secondary | ICD-10-CM

## 2013-12-06 DIAGNOSIS — Z6841 Body Mass Index (BMI) 40.0 and over, adult: Secondary | ICD-10-CM

## 2013-12-06 DIAGNOSIS — E1165 Type 2 diabetes mellitus with hyperglycemia: Secondary | ICD-10-CM

## 2013-12-06 LAB — CBC WITH DIFFERENTIAL/PLATELET
Basophils Absolute: 0 10*3/uL (ref 0.0–0.1)
Basophils Relative: 0 % (ref 0–1)
EOS ABS: 0 10*3/uL (ref 0.0–0.7)
EOS PCT: 0 % (ref 0–5)
HEMATOCRIT: 44.5 % (ref 39.0–52.0)
Hemoglobin: 15.1 g/dL (ref 13.0–17.0)
Lymphocytes Relative: 8 % — ABNORMAL LOW (ref 12–46)
Lymphs Abs: 0.5 10*3/uL — ABNORMAL LOW (ref 0.7–4.0)
MCH: 33.6 pg (ref 26.0–34.0)
MCHC: 33.9 g/dL (ref 30.0–36.0)
MCV: 98.9 fL (ref 78.0–100.0)
MONO ABS: 0.1 10*3/uL (ref 0.1–1.0)
Monocytes Relative: 1 % — ABNORMAL LOW (ref 3–12)
Neutro Abs: 5.2 10*3/uL (ref 1.7–7.7)
Neutrophils Relative %: 90 % — ABNORMAL HIGH (ref 43–77)
Platelets: 152 10*3/uL (ref 150–400)
RBC: 4.5 MIL/uL (ref 4.22–5.81)
RDW: 13.8 % (ref 11.5–15.5)
WBC: 5.8 10*3/uL (ref 4.0–10.5)

## 2013-12-06 LAB — GLUCOSE, CAPILLARY
GLUCOSE-CAPILLARY: 337 mg/dL — AB (ref 70–99)
GLUCOSE-CAPILLARY: 289 mg/dL — AB (ref 70–99)
Glucose-Capillary: 329 mg/dL — ABNORMAL HIGH (ref 70–99)
Glucose-Capillary: 282 mg/dL — ABNORMAL HIGH (ref 70–99)
Glucose-Capillary: 392 mg/dL — ABNORMAL HIGH (ref 70–99)

## 2013-12-06 LAB — COMPREHENSIVE METABOLIC PANEL
ALK PHOS: 102 U/L (ref 39–117)
ALT: 22 U/L (ref 0–53)
AST: 25 U/L (ref 0–37)
Albumin: 3.1 g/dL — ABNORMAL LOW (ref 3.5–5.2)
BUN: 26 mg/dL — ABNORMAL HIGH (ref 6–23)
CO2: 20 mEq/L (ref 19–32)
Calcium: 9.1 mg/dL (ref 8.4–10.5)
Chloride: 97 mEq/L (ref 96–112)
Creatinine, Ser: 0.91 mg/dL (ref 0.50–1.35)
GFR calc Af Amer: >90 mL/min (ref >90)
GFR calc non Af Amer: 81 mL/min — ABNORMAL LOW (ref >90)
GLUCOSE: 369 mg/dL — AB (ref 70–99)
POTASSIUM: 4.3 meq/L (ref 3.7–5.3)
SODIUM: 138 meq/L (ref 137–147)
TOTAL PROTEIN: 7.5 g/dL (ref 6.0–8.3)
Total Bilirubin: 0.3 mg/dL (ref 0.3–1.2)

## 2013-12-06 LAB — HEMOGLOBIN A1C
Hgb A1c MFr Bld: 8.1 % — ABNORMAL HIGH (ref <5.7)
Mean Plasma Glucose: 186 mg/dL — ABNORMAL HIGH (ref <117)

## 2013-12-06 LAB — MRSA PCR SCREENING: MRSA by PCR: NEGATIVE

## 2013-12-06 MED ORDER — ALBUTEROL SULFATE (2.5 MG/3ML) 0.083% IN NEBU: 2.5000 mg | INHALATION_SOLUTION | RESPIRATORY_TRACT | Status: DC | PRN

## 2013-12-06 MED ORDER — ALBUTEROL SULFATE (2.5 MG/3ML) 0.083% IN NEBU
2.5000 mg | INHALATION_SOLUTION | RESPIRATORY_TRACT | Status: DC
  Administered 2013-12-06: 2.5 mg via RESPIRATORY_TRACT
  Filled 2013-12-06: qty 3

## 2013-12-06 MED ORDER — OXYCODONE HCL 5 MG PO TABS: 5.0000 mg | ORAL_TABLET | Freq: Four times a day (QID) | ORAL | Status: DC | PRN

## 2013-12-06 MED ORDER — IPRATROPIUM-ALBUTEROL 0.5-2.5 (3) MG/3ML IN SOLN
3.0000 mL | Freq: Four times a day (QID) | RESPIRATORY_TRACT | Status: DC
  Administered 2013-12-06 – 2013-12-07 (×3): 3 mL via RESPIRATORY_TRACT
  Filled 2013-12-06 (×3): qty 3

## 2013-12-06 MED ORDER — INSULIN GLARGINE 100 UNIT/ML ~~LOC~~ SOLN
45.0000 [IU] | Freq: Every day | SUBCUTANEOUS | Status: DC
  Administered 2013-12-06: 45 [IU] via SUBCUTANEOUS
  Filled 2013-12-06 (×2): qty 0.45

## 2013-12-06 MED ORDER — ALBUTEROL SULFATE (2.5 MG/3ML) 0.083% IN NEBU
2.5000 mg | INHALATION_SOLUTION | Freq: Three times a day (TID) | RESPIRATORY_TRACT | Status: DC
  Administered 2013-12-06: 2.5 mg via RESPIRATORY_TRACT
  Filled 2013-12-06: qty 3

## 2013-12-06 MED ORDER — INSULIN GLARGINE 100 UNIT/ML ~~LOC~~ SOLN
40.0000 [IU] | Freq: Every day | SUBCUTANEOUS | Status: DC
  Administered 2013-12-06: 40 [IU] via SUBCUTANEOUS
  Filled 2013-12-06 (×3): qty 0.4

## 2013-12-06 MED ORDER — METHYLPREDNISOLONE SODIUM SUCC 40 MG IJ SOLR
40.0000 mg | Freq: Four times a day (QID) | INTRAMUSCULAR | Status: DC
  Administered 2013-12-06 – 2013-12-08 (×8): 40 mg via INTRAVENOUS
  Filled 2013-12-06 (×12): qty 1

## 2013-12-06 MED ORDER — OXYCODONE-ACETAMINOPHEN 5-325 MG PO TABS: 1.0000 | ORAL_TABLET | Freq: Four times a day (QID) | ORAL | Status: DC | PRN

## 2013-12-06 NOTE — Progress Notes (Signed)
Ambulated pt around unit, 1 full circle; initially started out on 6L, pt remained 100 percent O2; decreased pt to 4L and pt held stable between 90% and 97% O2; pt's RR tate ranged between 28-40; pt stated he felt good walking;

## 2013-12-06 NOTE — ED Notes (Signed)
Report to receiving RN on 2H.  Pt to be transported on monitor with RN accompanying.

## 2013-12-06 NOTE — Care Management Note (Addendum)
    Page 1 of 1   12/07/2013     10:18:57 AM   CARE MANAGEMENT NOTE 12/07/2013  Patient:  Joe Palmer,Joe Palmer   Account Number:  [id number]  Date Initiated:  12/06/2013  Documentation initiated by:  Junius CreamerWELL,DEBBIE  Subjective/Objective Assessment:   adm w copd exacerb     Action/Plan:   lives w wife, pcp dr Hope Buddselaine smith   Anticipated DC Date:     Anticipated DC Plan:        DC Planning Services  CM consult      PAC Choice  DURABLE MEDICAL EQUIPMENT   Choice offered to / List presented to:     DME arranged  OXYGEN      DME agency  [facility]        Status of service:   Medicare Important Message given?   (If response is "NO", the following Medicare IM given date fields will be blank) Date Medicare IM given:   Date Additional Medicare IM given:    Discharge Disposition:    Per UR Regulation:  Reviewed for med. necessity/level of care/duration of stay  If discussed at Long Length of Stay Meetings, dates discussed:    Comments:  3/25 1016a debbie Tilford Deaton rn,bsn spoke w pt. no pref to dme agencies. ref to [facility] that may be dc on 3-26. no sure of final liter flow at present. await final orders.

## 2013-12-06 NOTE — Consult Note (Signed)
PULMONARY / CRITICAL CARE MEDICINE   Name: Joe Palmer MRN: [id number] DOB: Apr 03, 1941    ADMISSION DATE:  12/05/2013 CONSULTATION DATE:  12/06/2013  REFERRING MD :  Rito Ehrlich PRIMARY SERVICE: [facility]  CHIEF COMPLAINT:  COPD Exacerbation  BRIEF PATIENT DESCRIPTION: 73 y.o. M w PMH COPD/asthma, OSA, presented 3/23 with AECOPD.  Saw PCP earlier in day, hypoxic in mid 80's, did not resolve after breathing rx, sent to ER for persistent resp distress.  Admitted under [facility]; however, minimal improvement in sx's, therefore PCCM consulted for assistance.  SIGNIFICANT EVENTS / STUDIES:  None  LINES / TUBES: None  CULTURES: None  ANTIBIOTICS: Doxy 3/23 >>>   HISTORY OF PRESENT ILLNESS:  [name] is a 73 y.o. M with PMH significant for COPD/asthma, OSA, DM, who presented with hypoxic respiratory failure on 3/23.  He saw his PCP earlier in the day and was hypoxic with SpO2 in 80s, did not respond to nebulizer Rx, so was sent to ER.  In ER, CXR, Trop, proBNP all WNL.  SpO2 in upper 80's/lower 90's while in ER.  Per pt, symptoms began 3 - 4 days ago and he feels it is because his allergies flared up. Given Solumedrol, Albuterol nebs, Spiriva, and put on NRB. Admitted under [facility].  Following day, pt with little improvement, PCCM consulted for further recs.   PAST MEDICAL HISTORY :  Past Medical History  Diagnosis Date  . Diabetes mellitus without complication   . Asthma   . Sleep apnea   . COPD (chronic obstructive pulmonary disease)    Past Surgical History  Procedure Laterality Date  . Tonsillectomy    . Vasectomy    . Back surgery    . Cataract surgery    . Knee surgery    . Replacement total knee bilateral     Prior to Admission medications   Medication Sig Start Date End Date Taking? Authorizing Provider  albuterol (PROVENTIL HFA;VENTOLIN HFA) 108 (90 BASE) MCG/ACT inhaler Inhale 2 puffs into the lungs every 6 (six) hours as needed for wheezing or shortness of breath.   Yes  Historical Provider, MD  albuterol (PROVENTIL) (2.5 MG/3ML) 0.083% nebulizer solution Inhale 3 mLs into the lungs every 6 (six) hours as needed for wheezing or shortness of breath.  11/04/13  Yes Historical Provider, MD  cetirizine (ZYRTEC) 10 MG tablet Take 10 mg by mouth daily as needed for allergies.   Yes Historical Provider, MD  fluticasone (FLONASE) 50 MCG/ACT nasal spray Place 1 spray into both nostrils daily. 10/05/13  Yes Historical Provider, MD  GLIPIZIDE XL 10 MG 24 hr tablet Take 10 mg by mouth daily. 10/05/13  Yes Historical Provider, MD  ibuprofen (ADVIL,MOTRIN) 200 MG tablet Take 800 mg by mouth every 6 (six) hours as needed for moderate pain.   Yes Historical Provider, MD  JANUVIA 100 MG tablet Take 100 mg by mouth daily. 11/07/13  Yes Historical Provider, MD  LANTUS SOLOSTAR 100 UNIT/ML Solostar Pen Inject 40 Units into the skin at bedtime. 11/30/13  Yes Historical Provider, MD  metFORMIN (GLUCOPHAGE) 850 MG tablet Take 850-1,700 mg by mouth 2 (two) times daily. Takes 1 tab in am, then 2 tabs throughout the rest of the day 10/12/13  Yes Historical Provider, MD  oxyCODONE-acetaminophen (PERCOCET) 10-325 MG per tablet Take 1 tablet by mouth every 6 (six) hours as needed for pain.  11/28/13  Yes Historical Provider, MD  SPIRIVA HANDIHALER 18 MCG inhalation capsule Place 1 capsule into inhaler and inhale daily.  09/19/13  Yes Historical Provider, MD   Allergies  Allergen Reactions  . Sulfa Antibiotics Other (See Comments)    unknown    FAMILY HISTORY:  History reviewed. No pertinent family history. SOCIAL HISTORY:  reports that he quit smoking about 5 years ago. His smoking use included Cigarettes. He smoked 0.00 packs per day. He does not have any smokeless tobacco history on file. He reports that he drinks alcohol. His drug history is not on file.  REVIEW OF SYSTEMS:  Negative except as stated in HPI.  SUBJECTIVE:   Occasional wheezing.  Denies chest pain or SOB but does endorse SOB  with exertion/activity.    VITAL SIGNS: Temp:  [97.3 F (36.3 C)-98.5 F (36.9 C)] 97.7 F (36.5 C) (03/24 1125) Pulse Rate:  [68-116] 104 (03/24 0735) Resp:  [15-22] 20 (03/24 0735) BP: (129-174)/(59-94) 149/89 mmHg (03/24 1125) SpO2:  [85 %-95 %] 92 % (03/24 1125) Weight:  [266 lb 12.1 oz (121 kg)] 266 lb 12.1 oz (121 kg) (03/24 0100) HEMODYNAMICS:   VENTILATOR SETTINGS:   INTAKE / OUTPUT: Intake/Output     03/23 0701 - 03/24 0700 03/24 0701 - 03/25 0700   P.O.  600   I.V. (mL/kg) 651.3 (5.4) 322.5 (2.7)   IV Piggyback 250 250   Total Intake(mL/kg) 901.3 (7.4) 1172.5 (9.7)   Urine (mL/kg/hr) 550    Total Output 550     Net +351.3 +1172.5        Urine Occurrence  1 x   Stool Occurrence  1 x     PHYSICAL EXAMINATION: General: Pleasant male, resting in chair, in NAD. Neuro: A&O x 3, non-focal.  HEENT: [place]/AT. PERRL, sclerae anicteric. Cardiovascular: RRR, no M/R/G.  Lungs: Respirations even and unlabored.  Scattered wheezes throughout, mainly expiratory. Abdomen: BS x 4, soft, NT/ND.  Musculoskeletal: No gross deformities, no edema.  Skin: Intact, warm, no rashes.    LABS:  CBC  Recent Labs Lab 12/05/13 1829 12/05/13 2140 12/06/13 0300  WBC 9.0 9.1 5.8  HGB 16.0 15.8 15.1  HCT 46.8 45.2 44.5  PLT 154 143* 152   Coag's No results found for this basename: APTT, INR,  in the last 168 hours BMET  Recent Labs Lab 12/05/13 1829 12/05/13 2140 12/06/13 0300  NA 140  --  138  K 4.2  --  4.3  CL 101  --  97  CO2 25  --  20  BUN 23  --  26*  CREATININE 0.85 0.91 0.91  GLUCOSE 170*  --  369*   Electrolytes  Recent Labs Lab 12/05/13 1829 12/06/13 0300  CALCIUM 9.4 9.1   Sepsis Markers No results found for this basename: LATICACIDVEN, PROCALCITON, O2SATVEN,  in the last 168 hours ABG  Recent Labs Lab 12/05/13 2228  PHART 7.385  PCO2ART 42.1  PO2ART 68.0*   Liver Enzymes  Recent Labs Lab 12/05/13 1829 12/06/13 0300  AST 25 25  ALT 23  22  ALKPHOS 110 102  BILITOT 0.4 0.3  ALBUMIN 3.5 3.1*   Cardiac Enzymes  Recent Labs Lab 12/05/13 1829  PROBNP 44.8   Glucose  Recent Labs Lab 12/06/13 0355 12/06/13 0818 12/06/13 1210  GLUCAP 337* 329* 282*    Imaging Dg Chest 2 View  12/05/2013   CLINICAL DATA:  Shortness of breath.  EXAM: CHEST  2 VIEW  COMPARISON:  DG CHEST 2 VIEW dated 06/07/2013; DG CHEST 2 VIEW dated 03/05/2010  FINDINGS: Elevated right hemidiaphragm. Heart size within normal limits. Thoracic spondylosis.  The lungs appear clear.  IMPRESSION: 1. Elevated right hemidiaphragm. 2. Thoracic spondylosis.   Electronically Signed   By: Herbie BaltimoreWalt  Liebkemann M.D.   On: 12/05/2013 19:51    ASSESSMENT / PLAN:  PULMONARY A: Acute Hypoxic Respiratory Failure AECOPD Asthma OSA P:   - BD therapy adjusted, Albuterol q3 PRN with DuoNebs q6h scheduled. - Steroid therapy adjusted, Solumedrol 40mg  q6. - Sniff test to check for right hemidiaphragm paralysis. - Check ambulatory saturation. - Check echo, assess pulm artery pressures.  All other problems, manager per primary team.  Rutherford Guysahul Desai, PA - [facility] Pgr: [phone]  or [phone]  Patient has history of COPD, this likely a COPD exacerbation, however right hemi-diaphragm is very elevated in comparison the left, another concern is cardiac in nature.  Will order a SNIF test and 2d echo, need to r/o pulmonary HTN.  All tests ordered.  Will f/u.  Will also need an ambulatory desat study as anticipate will need to go home with O2.  I have personally obtained a history, examined the patient, evaluated laboratory and imaging results, formulated the assessment and plan and placed orders.  Alyson ReedyWesam G. Hasheem Voland, M.D. [facility]. Pager: [phone]. After hours pager: [phone].

## 2013-12-06 NOTE — ED Notes (Signed)
Pt alert, sitting up in bed reading a book on ipad.  O2 sat dropping to 90-91.  O2 via [place] up to 5L.

## 2013-12-06 NOTE — Progress Notes (Signed)
[facility] PROGRESS NOTE  Joe Palmer [id number] DOB: 07/27/1941 DOA: 12/05/2013 PCP: Joe Palmer  Assessment/Plan: Principal Problem:   Decompensated COPD with exacerbation (chronic obstructive pulmonary disease): Appreciate pulmonary help. Continue nebs plus oxygen plus antibiotics. For echocardiogram to assess pulmonary vessels. For diaphragm study to assess paralysis. No evidence of acute CHF. No complaints of chest pain and steady onset of Active Problems:   DM (diabetes mellitus), type 2, uncontrolled: Have increased patient's basal dose of Lantus at home to higher given steroids.    Hypoxemia: Continue oxygen support. Please note patient's baseline his room air. Continue in stepdown while on 5-6 L Morbid obesity: Patient with BMI of 41 beats criteria.  Code Status: Full code Family Communication: Wife at the bedside.  Disposition Plan: Continue step down to oxygen need decreased   Consultants:  Pulmonary  Procedures:  Echocardiogram ordered  Antibiotics:  IV doxycycline 3/23-present  HPI/Subjective: Patient states she's feeling better than yesterday. Still short of breath. His baseline is no oxygen is able to ambulate quite actively  Objective: Filed Vitals:   12/06/13 1600  BP: 141/84  Pulse: 97  Temp: 98.4 F (36.9 C)  Resp: 18    Intake/Output Summary (Last 24 hours) at 12/06/13 1705 Last data filed at 12/06/13 1400  Gross per 24 hour  Intake 2313.75 ml  Output    950 ml  Net 1363.75 ml   Filed Weights   12/06/13 0100  Weight: 121 kg (266 lb 12.1 oz)    Exam:   General:  Alert and oriented x3, no acute distress  Cardiovascular: Regular rate and rhythm, S1-S2  Respiratory: Decreased breath sounds throughout, bilateral wheezing, tight  Abdomen: Soft, obese, nontender, positive bowel sounds  Musculoskeletal: No clubbing or cyanosis, trace edema   Data Reviewed: Basic Metabolic Panel:  Recent Labs Lab  12/05/13 1829 12/05/13 2140 12/06/13 0300  NA 140  --  138  K 4.2  --  4.3  CL 101  --  97  CO2 25  --  20  GLUCOSE 170*  --  369*  BUN 23  --  26*  CREATININE 0.85 0.91 0.91  CALCIUM 9.4  --  9.1   Liver Function Tests:  Recent Labs Lab 12/05/13 1829 12/06/13 0300  AST 25 25  ALT 23 22  ALKPHOS 110 102  BILITOT 0.4 0.3  PROT 7.9 7.5  ALBUMIN 3.5 3.1*   No results found for this basename: LIPASE, AMYLASE,  in the last 168 hours No results found for this basename: AMMONIA,  in the last 168 hours CBC:  Recent Labs Lab 12/05/13 1829 12/05/13 2140 12/06/13 0300  WBC 9.0 9.1 5.8  NEUTROABS 5.7  --  5.2  HGB 16.0 15.8 15.1  HCT 46.8 45.2 44.5  MCV 98.3 97.6 98.9  PLT 154 143* 152   Cardiac Enzymes: No results found for this basename: CKTOTAL, CKMB, CKMBINDEX, TROPONINI,  in the last 168 hours BNP (last 3 results)  Recent Labs  12/05/13 1829  PROBNP 44.8   CBG:  Recent Labs Lab 12/06/13 0355 12/06/13 0818 12/06/13 1210 12/06/13 1605  GLUCAP 337* 329* 282* 289*    Recent Results (from the past 240 hour(s))  MRSA PCR SCREENING     Status: None   Collection Time    12/06/13  1:05 AM      Result Value Ref Range Status   MRSA by PCR NEGATIVE  NEGATIVE Final   Comment:  The GeneXpert MRSA Assay (FDA     approved for NASAL specimens     only), is one component of a     comprehensive MRSA colonization     surveillance program. It is not     intended to diagnose MRSA     infection nor to guide or     monitor treatment for     MRSA infections.     Studies: Dg Chest 2 View  12/05/2013   CLINICAL DATA:  Shortness of breath.  EXAM: CHEST  2 VIEW  COMPARISON:  DG CHEST 2 VIEW dated 06/07/2013; DG CHEST 2 VIEW dated 03/05/2010  FINDINGS: Elevated right hemidiaphragm. Heart size within normal limits. Thoracic spondylosis. The lungs appear clear.  IMPRESSION: 1. Elevated right hemidiaphragm. 2. Thoracic spondylosis.   Electronically Signed   By: Herbie Baltimore M.D.   On: 12/05/2013 19:51   Dg Sniff Test  12/06/2013   CLINICAL DATA:  Right hemi diaphragm paralysis  EXAM: SNIFF TEST  TECHNIQUE: Fluoroscopy of the diaphragm was performed throughout the respiratory cycle.  FLUOROSCOPY TIME:  1 min 1 second  COMPARISON:  Chest x-ray from yesterday  FINDINGS: The right diaphragm has been elevated since at least 10/03/2008. On recent chest radiography, there is no evidence of mediastinal mass. No indication of previous cervical spine surgery or median sternotomy.  The right diaphragm moves with the left diaphragm throughout the respiratory cycle, including with "Sniff", although its excursion is blunted. No paradoxical movement.  IMPRESSION: Negative for right diaphragmatic paralysis. The right diaphragm elevation is related to weakness/ eventration.   Electronically Signed   By: Tiburcio Pea M.D.   On: 12/06/2013 15:58    Scheduled Meds: . doxycycline (VIBRAMYCIN) IV  100 mg Intravenous Q12H  . heparin  5,000 Units Subcutaneous 3 times per day  . insulin aspart  0-15 Units Subcutaneous 6 times per day  . insulin glargine  45 Units Subcutaneous QHS  . ipratropium-albuterol  3 mL Nebulization Q6H  . loratadine  10 mg Oral Daily  . methylPREDNISolone (SOLU-MEDROL) injection  40 mg Intravenous Q6H   Continuous Infusions: . sodium chloride Stopped (12/06/13 1118)    Principal Problem:   Decompensated COPD with exacerbation (chronic obstructive pulmonary disease) Active Problems:   DM (diabetes mellitus), type 2, uncontrolled   Hypoxemia    Time spent: 35 minutes    [name]  [facility] Pager [phone]. If 7PM-7AM, please contact night-coverage at [email or web address], password [facility] 12/06/2013, 5:05 PM  LOS: 1 day

## 2013-12-06 NOTE — Progress Notes (Signed)
Inpatient Diabetes Program Recommendations  AACE/ADA: New Consensus Statement on Inpatient Glycemic Control (2013)  Target Ranges:  Prepandial:   less than 140 mg/dL      Peak postprandial:   less than 180 mg/dL (1-2 hours)      Critically ill patients:  140 - 180 mg/dL  Results for Joe Palmer, Joe Palmer (MRN [id number]) as of 12/06/2013 09:43  Ref. Range 12/06/2013 03:55 12/06/2013 08:18  Glucose-Capillary Latest Range: 70-99 mg/dL 706337 (H) 237329 (H)   Inpatient Diabetes Program Recommendations Correction (SSI): Increase to RESISTANT during steroid therapy Insulin - Meal Coverage: add Novolog 4 units TID with meals  Thank you  Piedad ClimesGina Bassam Dresch BSN, RN,CDE Inpatient Diabetes Coordinator [phone] (team pager)

## 2013-12-07 DIAGNOSIS — G4733 Obstructive sleep apnea (adult) (pediatric): Secondary | ICD-10-CM

## 2013-12-07 DIAGNOSIS — I369 Nonrheumatic tricuspid valve disorder, unspecified: Secondary | ICD-10-CM

## 2013-12-07 LAB — CBC WITH DIFFERENTIAL/PLATELET
Basophils Absolute: 0 10*3/uL (ref 0.0–0.1)
Basophils Relative: 0 % (ref 0–1)
Eosinophils Absolute: 0 10*3/uL (ref 0.0–0.7)
Eosinophils Relative: 0 % (ref 0–5)
HCT: 42.4 % (ref 39.0–52.0)
HEMOGLOBIN: 14.6 g/dL (ref 13.0–17.0)
Lymphocytes Relative: 7 % — ABNORMAL LOW (ref 12–46)
Lymphs Abs: 0.9 10*3/uL (ref 0.7–4.0)
MCH: 34 pg (ref 26.0–34.0)
MCHC: 34.4 g/dL (ref 30.0–36.0)
MCV: 98.6 fL (ref 78.0–100.0)
MONO ABS: 0.8 10*3/uL (ref 0.1–1.0)
MONOS PCT: 6 % (ref 3–12)
Neutro Abs: 11.2 10*3/uL — ABNORMAL HIGH (ref 1.7–7.7)
Neutrophils Relative %: 87 % — ABNORMAL HIGH (ref 43–77)
Platelets: 145 10*3/uL — ABNORMAL LOW (ref 150–400)
RBC: 4.3 MIL/uL (ref 4.22–5.81)
RDW: 13.9 % (ref 11.5–15.5)
WBC: 12.9 10*3/uL — ABNORMAL HIGH (ref 4.0–10.5)

## 2013-12-07 LAB — GLUCOSE, CAPILLARY
GLUCOSE-CAPILLARY: 285 mg/dL — AB (ref 70–99)
GLUCOSE-CAPILLARY: 277 mg/dL — AB (ref 70–99)
GLUCOSE-CAPILLARY: 304 mg/dL — AB (ref 70–99)
Glucose-Capillary: 256 mg/dL — ABNORMAL HIGH (ref 70–99)
Glucose-Capillary: 350 mg/dL — ABNORMAL HIGH (ref 70–99)
Glucose-Capillary: 237 mg/dL — ABNORMAL HIGH (ref 70–99)

## 2013-12-07 LAB — COMPREHENSIVE METABOLIC PANEL
ALT: 20 U/L (ref 0–53)
AST: 22 U/L (ref 0–37)
Albumin: 3.1 g/dL — ABNORMAL LOW (ref 3.5–5.2)
Alkaline Phosphatase: 95 U/L (ref 39–117)
BUN: 28 mg/dL — ABNORMAL HIGH (ref 6–23)
CALCIUM: 9 mg/dL (ref 8.4–10.5)
CO2: 23 meq/L (ref 19–32)
Chloride: 99 mEq/L (ref 96–112)
Creatinine, Ser: 0.82 mg/dL (ref 0.50–1.35)
GFR calc Af Amer: >90 mL/min (ref >90)
GFR, EST NON AFRICAN AMERICAN: 84 mL/min — AB (ref >90)
Glucose, Bld: 276 mg/dL — ABNORMAL HIGH (ref 70–99)
POTASSIUM: 5 meq/L (ref 3.7–5.3)
SODIUM: 137 meq/L (ref 137–147)
Total Bilirubin: 0.3 mg/dL (ref 0.3–1.2)
Total Protein: 7.2 g/dL (ref 6.0–8.3)

## 2013-12-07 MED ORDER — INSULIN ASPART 100 UNIT/ML ~~LOC~~ SOLN
0.0000 [IU] | Freq: Three times a day (TID) | SUBCUTANEOUS | Status: DC
  Administered 2013-12-07: 7 [IU] via SUBCUTANEOUS
  Administered 2013-12-08: 11 [IU] via SUBCUTANEOUS
  Administered 2013-12-08: 15 [IU] via SUBCUTANEOUS

## 2013-12-07 MED ORDER — IPRATROPIUM-ALBUTEROL 0.5-2.5 (3) MG/3ML IN SOLN
3.0000 mL | Freq: Three times a day (TID) | RESPIRATORY_TRACT | Status: DC
  Administered 2013-12-08 (×2): 3 mL via RESPIRATORY_TRACT
  Filled 2013-12-07 (×2): qty 3

## 2013-12-07 MED ORDER — INSULIN GLARGINE 100 UNIT/ML ~~LOC~~ SOLN
55.0000 [IU] | Freq: Every day | SUBCUTANEOUS | Status: DC
  Administered 2013-12-07: 55 [IU] via SUBCUTANEOUS
  Filled 2013-12-07 (×2): qty 0.55

## 2013-12-07 MED ORDER — INSULIN ASPART 100 UNIT/ML ~~LOC~~ SOLN
0.0000 [IU] | Freq: Every day | SUBCUTANEOUS | Status: DC
  Administered 2013-12-07: 4 [IU] via SUBCUTANEOUS

## 2013-12-07 NOTE — Progress Notes (Addendum)
Echocardiogram 2D Echocardiogram has been performed.  Joe Palmer, Joe Palmer 12/07/2013, 2:50 PM

## 2013-12-07 NOTE — Progress Notes (Addendum)
[facility] Progress Note [place] TEAM 1 - Stepdown/ICU TEAM   Joe Palmer:[id number] DOB: Jan 07, 1941 DOA: 12/05/2013 PCP: Astrid Divine, MD  Brief narrative: Joe Palmer is a 73 y.o. male presenting on 12/05/2013 with  has a past medical history of Diabetes mellitus without complication; Asthma; Sleep apnea; and COPD who presents with dyspnea related to a COPD exacerbation.    Subjective: Feeling much better- noted to be completely off O2- 92% at rest and 89-90% with ambulation per RN  Assessment/Plan: Principal Problem:   Decompensated COPD with exacerbation - Acute hypoxic resp failure - much improved but still on IV Solumedrol per PCCM team- will have them taper to Prednisone prior to d/c home (which should likley be tomorrow). - also awaiting ECHO to assess for pulm HTN  Active Problems:   DM (diabetes mellitus), type 2, uncontrolled - will increase Lantus for tonight as sugars quite high- sliding scale will also be increased to high dose    Morbid obesity with BMI of 40.0-44.9, adult - needs to lose weight  OSA - cont CPAP at night    Code Status: Full code  Family Communication: none today- Dr Rito Ehrlich spoke with wife at bedside on 3/24 Disposition Plan: transfer to med/surg  Consultants: PCCM  Procedures: none  Antibiotics: Antibiotics Given (last 72 hours)   None       DVT prophylaxis: Heparin  Objective: Filed Weights   12/06/13 0100 12/07/13 0349  Weight: 121 kg (266 lb 12.1 oz) 121.2 kg (267 lb 3.2 oz)   Blood pressure 164/79, pulse 86, temperature 97.5 F (36.4 C), temperature source Oral, resp. rate 20, height 6' (1.829 m), weight 121.2 kg (267 lb 3.2 oz), SpO2 92.00%.  Intake/Output Summary (Last 24 hours) at 12/07/13 1442 Last data filed at 12/07/13 1300  Gross per 24 hour  Intake   2180 ml  Output    625 ml  Net   1555 ml     Exam: General: AAO x 3, No acute respiratory distress Lungs: Clear to  auscultation bilaterally without wheezes or crackles Cardiovascular: Regular rate and rhythm without murmur gallop or rub normal S1 and S2 Abdomen: Nontender, nondistended, soft, bowel sounds positive, no rebound, no ascites, no appreciable mass Extremities: No significant cyanosis, clubbing, or edema bilateral lower extremities  Data Reviewed: Basic Metabolic Panel:  Recent Labs Lab 12/05/13 1829 12/05/13 2140 12/06/13 0300 12/07/13 0215  NA 140  --  138 137  K 4.2  --  4.3 5.0  CL 101  --  97 99  CO2 25  --  20 23  GLUCOSE 170*  --  369* 276*  BUN 23  --  26* 28*  CREATININE 0.85 0.91 0.91 0.82  CALCIUM 9.4  --  9.1 9.0   Liver Function Tests:  Recent Labs Lab 12/05/13 1829 12/06/13 0300 12/07/13 0215  AST 25 25 22   ALT 23 22 20   ALKPHOS 110 102 95  BILITOT 0.4 0.3 0.3  PROT 7.9 7.5 7.2  ALBUMIN 3.5 3.1* 3.1*   No results found for this basename: LIPASE, AMYLASE,  in the last 168 hours No results found for this basename: AMMONIA,  in the last 168 hours CBC:  Recent Labs Lab 12/05/13 1829 12/05/13 2140 12/06/13 0300 12/07/13 0215  WBC 9.0 9.1 5.8 12.9*  NEUTROABS 5.7  --  5.2 11.2*  HGB 16.0 15.8 15.1 14.6  HCT 46.8 45.2 44.5 42.4  MCV 98.3 97.6 98.9 98.6  PLT 154 143* 152 145*  Cardiac Enzymes: No results found for this basename: CKTOTAL, CKMB, CKMBINDEX, TROPONINI,  in the last 168 hours BNP (last 3 results)  Recent Labs  12/05/13 1829  PROBNP 44.8   CBG:  Recent Labs Lab 12/06/13 1946 12/06/13 2307 12/07/13 0343 12/07/13 0808 12/07/13 1139  GLUCAP 392* 285* 277* 256* 350*    Recent Results (from the past 240 hour(s))  MRSA PCR SCREENING     Status: None   Collection Time    12/06/13  1:05 AM      Result Value Ref Range Status   MRSA by PCR NEGATIVE  NEGATIVE Final   Comment:            The GeneXpert MRSA Assay (FDA     approved for NASAL specimens     only), is one component of a     comprehensive MRSA colonization      surveillance program. It is not     intended to diagnose MRSA     infection nor to guide or     monitor treatment for     MRSA infections.     Studies:  Recent x-ray studies have been reviewed in detail by the Attending Physician  Scheduled Meds:  Scheduled Meds: . doxycycline (VIBRAMYCIN) IV  100 mg Intravenous Q12H  . heparin  5,000 Units Subcutaneous 3 times per day  . insulin aspart  0-15 Units Subcutaneous 6 times per day  . insulin glargine  55 Units Subcutaneous QHS  . ipratropium-albuterol  3 mL Nebulization Q6H  . loratadine  10 mg Oral Daily  . methylPREDNISolone (SOLU-MEDROL) injection  40 mg Intravenous Q6H   Continuous Infusions: . sodium chloride Stopped (12/06/13 1118)    Time spent on care of this patient: 30 min   Pasha Gadison, MD  [facility] Office  [phone] Pager - Text Page per Loretha StaplerAmion as per below:  On-Call/Text Page:      [email or web address]  If 7PM-7AM, please contact night-coverage [email or web address] 12/07/2013, 2:42 PM   LOS: 2 days

## 2013-12-07 NOTE — Progress Notes (Signed)
PULMONARY / CRITICAL CARE MEDICINE   Name: Joe Palmer MRN: [id number] DOB: January 03, 1941    ADMISSION DATE:  12/05/2013 CONSULTATION DATE:  12/06/2013  REFERRING MD :  Rito Ehrlich PRIMARY SERVICE: [facility]  CHIEF COMPLAINT:  COPD Exacerbation  BRIEF PATIENT DESCRIPTION: 73 y.o. M w PMH COPD/asthma, OSA, presented 3/23 with AECOPD.  Saw PCP earlier in day, hypoxic in mid 80's, did not resolve after breathing rx, sent to ER for persistent resp distress.  Admitted under [facility]; however, minimal improvement in sx's, therefore PCCM consulted for assistance.  SIGNIFICANT EVENTS / STUDIES:  None  LINES / TUBES: None  CULTURES: None  ANTIBIOTICS: Doxy 3/23 >>>  SUBJECTIVE:  No events overnight, O2 demand decreasing.    VITAL SIGNS: Temp:  [97.6 F (36.4 C)-98.7 F (37.1 C)] 97.6 F (36.4 C) (03/25 0805) Pulse Rate:  [79-106] 82 (03/25 0805) Resp:  [15-23] 20 (03/25 0805) BP: (141-160)/(60-93) 149/93 mmHg (03/25 0805) SpO2:  [91 %-96 %] 93 % (03/25 0846) Weight:  [267 lb 3.2 oz (121.2 kg)] 267 lb 3.2 oz (121.2 kg) (03/25 0349) HEMODYNAMICS:   VENTILATOR SETTINGS:   INTAKE / OUTPUT: Intake/Output     03/24 0701 - 03/25 0700 03/25 0701 - 03/26 0700   P.O. 1560 480   I.V. (mL/kg) 322.5 (2.7)    IV Piggyback 500 250   Total Intake(mL/kg) 2382.5 (19.7) 730 (6)   Urine (mL/kg/hr) 825 (0.3) 200 (0.6)   Total Output 825 200   Net +1557.5 +530        Urine Occurrence 2 x    Stool Occurrence 1 x 1 x     PHYSICAL EXAMINATION: General: Pleasant male, resting in chair, in NAD. Neuro: A&O x 3, non-focal.  HEENT: [place]/AT. PERRL, sclerae anicteric. Cardiovascular: RRR, no M/R/G.  Lungs: Respirations even and unlabored.  Scattered wheezes throughout, mainly expiratory. Abdomen: BS x 4, soft, NT/ND.  Musculoskeletal: No gross deformities, no edema.  Skin: Intact, warm, no rashes.    LABS:  CBC  Recent Labs Lab 12/05/13 2140 12/06/13 0300 12/07/13 0215  WBC 9.1 5.8 12.9*  HGB  15.8 15.1 14.6  HCT 45.2 44.5 42.4  PLT 143* 152 145*   Coag's No results found for this basename: APTT, INR,  in the last 168 hours BMET  Recent Labs Lab 12/05/13 1829 12/05/13 2140 12/06/13 0300 12/07/13 0215  NA 140  --  138 137  K 4.2  --  4.3 5.0  CL 101  --  97 99  CO2 25  --  20 23  BUN 23  --  26* 28*  CREATININE 0.85 0.91 0.91 0.82  GLUCOSE 170*  --  369* 276*   Electrolytes  Recent Labs Lab 12/05/13 1829 12/06/13 0300 12/07/13 0215  CALCIUM 9.4 9.1 9.0   Sepsis Markers No results found for this basename: LATICACIDVEN, PROCALCITON, O2SATVEN,  in the last 168 hours ABG  Recent Labs Lab 12/05/13 2228  PHART 7.385  PCO2ART 42.1  PO2ART 68.0*   Liver Enzymes  Recent Labs Lab 12/05/13 1829 12/06/13 0300 12/07/13 0215  AST 25 25 22   ALT 23 22 20   ALKPHOS 110 102 95  BILITOT 0.4 0.3 0.3  ALBUMIN 3.5 3.1* 3.1*   Cardiac Enzymes  Recent Labs Lab 12/05/13 1829  PROBNP 44.8   Glucose  Recent Labs Lab 12/06/13 1210 12/06/13 1605 12/06/13 1946 12/06/13 2307 12/07/13 0343 12/07/13 0808  GLUCAP 282* 289* 392* 285* 277* 256*    Imaging Dg Chest 2 View  12/05/2013  CLINICAL DATA:  Shortness of breath.  EXAM: CHEST  2 VIEW  COMPARISON:  DG CHEST 2 VIEW dated 06/07/2013; DG CHEST 2 VIEW dated 03/05/2010  FINDINGS: Elevated right hemidiaphragm. Heart size within normal limits. Thoracic spondylosis. The lungs appear clear.  IMPRESSION: 1. Elevated right hemidiaphragm. 2. Thoracic spondylosis.   Electronically Signed   By: Herbie BaltimoreWalt  Liebkemann M.D.   On: 12/05/2013 19:51   Dg Sniff Test  12/06/2013   CLINICAL DATA:  Right hemi diaphragm paralysis  EXAM: SNIFF TEST  TECHNIQUE: Fluoroscopy of the diaphragm was performed throughout the respiratory cycle.  FLUOROSCOPY TIME:  1 min 1 second  COMPARISON:  Chest x-ray from yesterday  FINDINGS: The right diaphragm has been elevated since at least 10/03/2008. On recent chest radiography, there is no evidence of  mediastinal mass. No indication of previous cervical spine surgery or median sternotomy.  The right diaphragm moves with the left diaphragm throughout the respiratory cycle, including with "Sniff", although its excursion is blunted. No paradoxical movement.  IMPRESSION: Negative for right diaphragmatic paralysis. The right diaphragm elevation is related to weakness/ eventration.   Electronically Signed   By: Tiburcio PeaJonathan  Watts M.D.   On: 12/06/2013 15:58    ASSESSMENT / PLAN:  PULMONARY A: Acute Hypoxic Respiratory Failure AECOPD Asthma OSA Anticipate this is all COPD exacerbation and patient will need home O2 for sometime until over seasonal allergy exacerbated COPD exacerbation.  Still need to R/O cardiac effects given history. P:   - BD therapy adjusted, Albuterol q3 PRN with DuoNebs q6h scheduled. - Steroid therapy adjusted, Solumedrol 40mg  q6. - Sniff test negative for paralysis. - Ambulatory saturation. - Check echo, assess pulm artery pressures. - Consult case management for home O2. - Titrate O2 for sat of 88-92%. - Recommend keeping dry.  Alyson ReedyWesam G. Yacoub, M.D. [facility] Pulmonary/Critical Care Medicine. Pager: [phone]. After hours pager: [phone].

## 2013-12-07 NOTE — Progress Notes (Signed)
Results for Joe Palmer, Joe Palmer (MRN [id number]) as of 12/07/2013 13:05  Ref. Range 12/06/2013 23:07 12/07/2013 03:43 12/07/2013 08:08 12/07/2013 11:39  Glucose-Capillary Latest Range: 70-99 mg/dL 409285 (H) 811277 (H) 914256 (H) 350 (H)  CBGs continue to be greater than 180 mg/dl.  Recommend adding Novolog 4 units TID with meals if CBGs continue to be elevated.  Will follow.  Smith MinceKendra Shalyn Koral RN BSN CDE

## 2013-12-07 NOTE — Progress Notes (Signed)
Patient refused CPAP for the night. Patient stated he does not wear CPAP at home

## 2013-12-08 LAB — CBC WITH DIFFERENTIAL/PLATELET
BASOS ABS: 0 10*3/uL (ref 0.0–0.1)
BASOS PCT: 0 % (ref 0–1)
Eosinophils Absolute: 0 10*3/uL (ref 0.0–0.7)
Eosinophils Relative: 0 % (ref 0–5)
HCT: 43.9 % (ref 39.0–52.0)
HEMOGLOBIN: 15.1 g/dL (ref 13.0–17.0)
LYMPHS PCT: 8 % — AB (ref 12–46)
Lymphs Abs: 1 10*3/uL (ref 0.7–4.0)
MCH: 33.6 pg (ref 26.0–34.0)
MCHC: 34.4 g/dL (ref 30.0–36.0)
MCV: 97.6 fL (ref 78.0–100.0)
Monocytes Absolute: 0.8 10*3/uL (ref 0.1–1.0)
Monocytes Relative: 6 % (ref 3–12)
NEUTROS ABS: 10.4 10*3/uL — AB (ref 1.7–7.7)
Neutrophils Relative %: 86 % — ABNORMAL HIGH (ref 43–77)
Platelets: 150 10*3/uL (ref 150–400)
RBC: 4.5 MIL/uL (ref 4.22–5.81)
RDW: 13.6 % (ref 11.5–15.5)
WBC: 12.2 10*3/uL — ABNORMAL HIGH (ref 4.0–10.5)

## 2013-12-08 LAB — COMPREHENSIVE METABOLIC PANEL
ALK PHOS: 93 U/L (ref 39–117)
ALT: 35 U/L (ref 0–53)
AST: 43 U/L — ABNORMAL HIGH (ref 0–37)
Albumin: 3.2 g/dL — ABNORMAL LOW (ref 3.5–5.2)
BUN: 30 mg/dL — ABNORMAL HIGH (ref 6–23)
CALCIUM: 8.6 mg/dL (ref 8.4–10.5)
CO2: 26 mEq/L (ref 19–32)
Chloride: 98 mEq/L (ref 96–112)
Creatinine, Ser: 0.84 mg/dL (ref 0.50–1.35)
GFR, EST NON AFRICAN AMERICAN: 84 mL/min — AB (ref >90)
GLUCOSE: 248 mg/dL — AB (ref 70–99)
Potassium: 4.9 mEq/L (ref 3.7–5.3)
Sodium: 136 mEq/L — ABNORMAL LOW (ref 137–147)
TOTAL PROTEIN: 7.3 g/dL (ref 6.0–8.3)
Total Bilirubin: 0.4 mg/dL (ref 0.3–1.2)

## 2013-12-08 LAB — GLUCOSE, CAPILLARY
GLUCOSE-CAPILLARY: 308 mg/dL — AB (ref 70–99)
GLUCOSE-CAPILLARY: 282 mg/dL — AB (ref 70–99)

## 2013-12-08 MED ORDER — PREDNISONE 10 MG PO TABS: 10.0000 mg | ORAL_TABLET | Freq: Every day | ORAL | Status: DC

## 2013-12-08 MED ORDER — DOXYCYCLINE HYCLATE 50 MG PO CAPS: 50.0000 mg | ORAL_CAPSULE | Freq: Two times a day (BID) | ORAL | Status: DC

## 2013-12-08 MED ORDER — FLUTICASONE-SALMETEROL 500-50 MCG/DOSE IN AEPB: 1.0000 | INHALATION_SPRAY | Freq: Two times a day (BID) | RESPIRATORY_TRACT | Status: DC

## 2013-12-08 NOTE — Progress Notes (Signed)
12/08/13 1530 nsg Pt d/c to home; d/c instructions given to pt and wife; d/c instructions understood;  belongings given to pt by NT (from security); home 02 order d/c by MD order noted.

## 2013-12-08 NOTE — Progress Notes (Addendum)
PULMONARY / CRITICAL CARE MEDICINE  Name: Joe Palmer MRN: [id number] DOB: 12-11-1940    ADMISSION DATE:  12/05/2013 CONSULTATION DATE:  12/06/2013  [facility]: [facility]  CHIEF COMPLAINT:  COPD exacerbation  BRIEF PATIENT DESCRIPTION: 73 yo with COPD /a sthma, OSA, presented 3/23 with AECOPD.  Saw PCP earlier in day, hypoxic in mid 80's, did not resolve after breathing rx, sent to ER for persistent resp distress.  Admitted under [facility]; however, minimal improvement in symptoms, therefore PCCM consulted for assistance.  SIGNIFICANT EVENTS / STUDIES:   LINES / TUBES:  CULTURES:  ANTIBIOTICS: Doxycycline 3/23 >>>  INTERVAL HISTORY:  No events overnight, off supplemental O2 since yesterday. No complaints. O2 sats at res 93%, with ambulation 89-90%.   VITAL SIGNS: Temp:  [97.5 F (36.4 C)-98 F (36.7 C)] 97.6 F (36.4 C) (03/26 0507) Pulse Rate:  [80-92] 80 (03/26 0507) Resp:  [18-22] 18 (03/26 0507) BP: (138-164)/(72-107) 138/107 mmHg (03/26 0507) SpO2:  [90 %-92 %] 91 % (03/26 0507) Weight:  [121.8 kg (268 lb 8.3 oz)-122.9 kg (270 lb 15.1 oz)] 121.8 kg (268 lb 8.3 oz) (03/26 0500)  HEMODYNAMICS:   VENTILATOR SETTINGS:   INTAKE / OUTPUT: Intake/Output     03/25 0701 - 03/26 0700 03/26 0701 - 03/27 0700   P.O. 960    I.V. (mL/kg)     IV Piggyback 250    Total Intake(mL/kg) 1210 (9.9)    Urine (mL/kg/hr) 600 (0.2)    Total Output 600     Net +610          Urine Occurrence     Stool Occurrence 2 x     PHYSICAL EXAMINATION: General: Pleasant male, resting in chair, in NAD. Neuro: A&O x 3, non-focal.  HEENT: [place]/AT. PERRL, sclerae anicteric. Cardiovascular: RRR, no M/R/G.  Lungs: Respirations even and unlabored.  Scattered wheezes throughout, mainly expiratory. Abdomen: BS x 4, soft, NT/ND.  Musculoskeletal: No gross deformities, no edema.  Skin: Intact, warm, no rashes.  LABS:  CBC  Recent Labs Lab 12/06/13 0300 12/07/13 0215  12/08/13 0452  WBC 5.8 12.9* 12.2*  HGB 15.1 14.6 15.1  HCT 44.5 42.4 43.9  PLT 152 145* 150   Coag's No results found for this basename: APTT, INR,  in the last 168 hours BMET  Recent Labs Lab 12/06/13 0300 12/07/13 0215 12/08/13 0452  NA 138 137 136*  K 4.3 5.0 4.9  CL 97 99 98  CO2 20 23 26   BUN 26* 28* 30*  CREATININE 0.91 0.82 0.84  GLUCOSE 369* 276* 248*   Electrolytes  Recent Labs Lab 12/06/13 0300 12/07/13 0215 12/08/13 0452  CALCIUM 9.1 9.0 8.6   Sepsis Markers No results found for this basename: LATICACIDVEN, PROCALCITON, O2SATVEN,  in the last 168 hours ABG  Recent Labs Lab 12/05/13 2228  PHART 7.385  PCO2ART 42.1  PO2ART 68.0*   Liver Enzymes  Recent Labs Lab 12/06/13 0300 12/07/13 0215 12/08/13 0452  AST 25 22 43*  ALT 22 20 35  ALKPHOS 102 95 93  BILITOT 0.3 0.3 0.4  ALBUMIN 3.1* 3.1* 3.2*   Cardiac Enzymes  Recent Labs Lab 12/05/13 1829  PROBNP 44.8   Glucose  Recent Labs Lab 12/06/13 2307 12/07/13 0343 12/07/13 0808 12/07/13 1139 12/07/13 1605 12/07/13 2219  GLUCAP 285* 277* 256* 350* 237* 304*   IMAGING:  Dg Sniff Test  12/06/2013   CLINICAL DATA:  Right hemi diaphragm paralysis  EXAM: SNIFF TEST  TECHNIQUE:  Fluoroscopy of the diaphragm was performed throughout the respiratory cycle.  FLUOROSCOPY TIME:  1 min 1 second  COMPARISON:  Chest x-ray from yesterday  FINDINGS: The right diaphragm has been elevated since at least 10/03/2008. On recent chest radiography, there is no evidence of mediastinal mass. No indication of previous cervical spine surgery or median sternotomy.  The right diaphragm moves with the left diaphragm throughout the respiratory cycle, including with "Sniff", although its excursion is blunted. No paradoxical movement.  IMPRESSION: Negative for right diaphragmatic paralysis. The right diaphragm elevation is related to weakness/ eventration.   Electronically Signed   By: Tiburcio Pea M.D.   On:  12/06/2013 15:58   ASSESSMENT / PLAN:  Acute ( on chronic ? ) hypoxemic respiratory failure  Would check ambulatory SpO2 before discharge  AECOPD Reactive airway disease  Prednisone 40 and taper over two weeks  Doxycycline to complete 10 days course  DuoNebs q4h and q2h PRN  Should eventually convert to Spiriva / Advair   OSA, non compliant with CPAP  Encourage CPAP use  Disposition  Scheduled for 12/15/2013 at 1500 with Rubye Oaks, NP  Joneen Roach, ACNP [place] [facility] Pager [phone] or [phone]  I have personally obtained history, examined patient, evaluated and interpreted laboratory and imaging results, reviewed medical records, formulated assessment / plan and placed orders.  Lonia Farber, MD Pulmonary and Critical Care Medicine [facility] Pager: [phone]  12/08/2013, 12:23 PM

## 2013-12-08 NOTE — Discharge Summary (Addendum)
Physician Discharge Summary  REMINGTON HIGHBAUGH BJY:[id number] DOB: 1941-02-11 DOA: 12/05/2013  PCP: Astrid Divine, MD  Admit date: 12/05/2013 Discharge date: 12/08/2013  Time spent: >35 minutes  Recommendations for Outpatient Follow-up:  F/u with pulmonology in 3-4 weeks F/u with PCP in 1-2 weeks  Discharge Diagnoses:  Principal Problem:   Decompensated COPD with exacerbation (chronic obstructive pulmonary disease) Active Problems:   DM (diabetes mellitus), type 2, uncontrolled   Hypoxemia   Morbid obesity with BMI of 40.0-44.9, adult   Discharge Condition: stable   Diet recommendation: DM  Filed Weights   12/07/13 0349 12/08/13 0006 12/08/13 0500  Weight: 121.2 kg (267 lb 3.2 oz) 122.9 kg (270 lb 15.1 oz) 121.8 kg (268 lb 8.3 oz)    History of present illness:  Joe Palmer is a 73 y.o. male presenting on 12/05/2013 with has a past medical history of Diabetes mellitus without complication; Asthma; Sleep apnea; and COPD who presents with dyspnea related to a COPD exacerbation.   Hospital Course:  1. Decompensated COPD with exacerbation - Acute hypoxic resp failure  - lung exam no wheezing significantly improved on steroids, bronchodilators atx; taper steroids, added advair recommended to follow up with pulmonology outpatient  2. DM (diabetes mellitus), continue insulin+medication regimen; outpatient titration as needed  3. Morbid obesity with BMI of 40.0-44.9, adult  - needs to lose weight  4. OSA  - cont CPAP at night    Procedures:  echo (i.e. Studies not automatically included, echos, thoracentesis, etc; not x-rays)  Consultations:  pulmonology   Discharge Exam: Filed Vitals:   12/08/13 1000  BP: 151/76  Pulse: 82  Temp: 98 F (36.7 C)  Resp: 18    General: alert Cardiovascular: s1,s2 rrr Respiratory: CTA BL  Discharge Instructions  Discharge Orders   Future Appointments Provider Department Dept Phone   12/15/2013 3:00 PM Julio Sicks,  NP [facility] [phone]   Future Orders Complete By Expires   Diet - low sodium heart healthy  As directed    Discharge instructions  As directed    Comments:     Please follow up with pulmonologist in 3-4 weeks   Increase activity slowly  As directed        Medication List         albuterol 108 (90 BASE) MCG/ACT inhaler  Commonly known as:  PROVENTIL HFA;VENTOLIN HFA  Inhale 2 puffs into the lungs every 6 (six) hours as needed for wheezing or shortness of breath.     albuterol (2.5 MG/3ML) 0.083% nebulizer solution  Commonly known as:  PROVENTIL  Inhale 3 mLs into the lungs every 6 (six) hours as needed for wheezing or shortness of breath.     cetirizine 10 MG tablet  Commonly known as:  ZYRTEC  Take 10 mg by mouth daily as needed for allergies.     doxycycline 50 MG capsule  Commonly known as:  VIBRAMYCIN  Take 1 capsule (50 mg total) by mouth 2 (two) times daily.     fluticasone 50 MCG/ACT nasal spray  Commonly known as:  FLONASE  Place 1 spray into both nostrils daily.     Fluticasone-Salmeterol 500-50 MCG/DOSE Aepb  Commonly known as:  ADVAIR DISKUS  Inhale 1 puff into the lungs 2 (two) times daily.     GLIPIZIDE XL 10 MG 24 hr tablet  Generic drug:  glipiZIDE  Take 10 mg by mouth daily.     ibuprofen 200 MG tablet  Commonly known as:  ADVIL,MOTRIN  Take 800 mg by mouth every 6 (six) hours as needed for moderate pain.     JANUVIA 100 MG tablet  Generic drug:  sitaGLIPtin  Take 100 mg by mouth daily.     LANTUS SOLOSTAR 100 UNIT/ML Solostar Pen  Generic drug:  Insulin Glargine  Inject 40 Units into the skin at bedtime.     metFORMIN 850 MG tablet  Commonly known as:  GLUCOPHAGE  Take 850-1,700 mg by mouth 2 (two) times daily. Takes 1 tab in am, then 2 tabs throughout the rest of the day     oxyCODONE-acetaminophen 10-325 MG per tablet  Commonly known as:  PERCOCET  Take 1 tablet by mouth every 6 (six) hours as needed for pain.      predniSONE 10 MG tablet  Commonly known as:  DELTASONE  Take 1 tablet (10 mg total) by mouth daily with breakfast.     SPIRIVA HANDIHALER 18 MCG inhalation capsule  Generic drug:  tiotropium  Place 1 capsule into inhaler and inhale daily.       Allergies  Allergen Reactions  . Sulfa Antibiotics Other (See Comments)    unknown       Follow-up Information   Follow up with PARRETT,TAMMY, NP On 12/15/2013. (3:00 PM - [facility])    Specialty:  Nurse Practitioner   Contact information:   [address] [place] [address] [phone]       Follow up with Astrid Divine, MD In 1 week.   Specialty:  Family Medicine   Contact information:   [address] [place] [address] [phone]        The results of significant diagnostics from this hospitalization (including imaging, microbiology, ancillary and laboratory) are listed below for reference.    Significant Diagnostic Studies: Dg Chest 2 View  12/05/2013   CLINICAL DATA:  Shortness of breath.  EXAM: CHEST  2 VIEW  COMPARISON:  DG CHEST 2 VIEW dated 06/07/2013; DG CHEST 2 VIEW dated 03/05/2010  FINDINGS: Elevated right hemidiaphragm. Heart size within normal limits. Thoracic spondylosis. The lungs appear clear.  IMPRESSION: 1. Elevated right hemidiaphragm. 2. Thoracic spondylosis.   Electronically Signed   By: Herbie Baltimore M.D.   On: 12/05/2013 19:51   Dg Sniff Test  12/06/2013   CLINICAL DATA:  Right hemi diaphragm paralysis  EXAM: SNIFF TEST  TECHNIQUE: Fluoroscopy of the diaphragm was performed throughout the respiratory cycle.  FLUOROSCOPY TIME:  1 min 1 second  COMPARISON:  Chest x-ray from yesterday  FINDINGS: The right diaphragm has been elevated since at least 10/03/2008. On recent chest radiography, there is no evidence of mediastinal mass. No indication of previous cervical spine surgery or median sternotomy.  The right diaphragm moves with the left diaphragm throughout the  respiratory cycle, including with "Sniff", although its excursion is blunted. No paradoxical movement.  IMPRESSION: Negative for right diaphragmatic paralysis. The right diaphragm elevation is related to weakness/ eventration.   Electronically Signed   By: Tiburcio Pea M.D.   On: 12/06/2013 15:58    Microbiology: Recent Results (from the past 240 hour(s))  MRSA PCR SCREENING     Status: None   Collection Time    12/06/13  1:05 AM      Result Value Ref Range Status   MRSA by PCR NEGATIVE  NEGATIVE Final   Comment:            The GeneXpert MRSA Assay (FDA     approved  for NASAL specimens     only), is one component of a     comprehensive MRSA colonization     surveillance program. It is not     intended to diagnose MRSA     infection nor to guide or     monitor treatment for     MRSA infections.     Labs: Basic Metabolic Panel:  Recent Labs Lab 12/05/13 1829 12/05/13 2140 12/06/13 0300 12/07/13 0215 12/08/13 0452  NA 140  --  138 137 136*  K 4.2  --  4.3 5.0 4.9  CL 101  --  97 99 98  CO2 25  --  20 23 26   GLUCOSE 170*  --  369* 276* 248*  BUN 23  --  26* 28* 30*  CREATININE 0.85 0.91 0.91 0.82 0.84  CALCIUM 9.4  --  9.1 9.0 8.6   Liver Function Tests:  Recent Labs Lab 12/05/13 1829 12/06/13 0300 12/07/13 0215 12/08/13 0452  AST 25 25 22  43*  ALT 23 22 20  35  ALKPHOS 110 102 95 93  BILITOT 0.4 0.3 0.3 0.4  PROT 7.9 7.5 7.2 7.3  ALBUMIN 3.5 3.1* 3.1* 3.2*   No results found for this basename: LIPASE, AMYLASE,  in the last 168 hours No results found for this basename: AMMONIA,  in the last 168 hours CBC:  Recent Labs Lab 12/05/13 1829 12/05/13 2140 12/06/13 0300 12/07/13 0215 12/08/13 0452  WBC 9.0 9.1 5.8 12.9* 12.2*  NEUTROABS 5.7  --  5.2 11.2* 10.4*  HGB 16.0 15.8 15.1 14.6 15.1  HCT 46.8 45.2 44.5 42.4 43.9  MCV 98.3 97.6 98.9 98.6 97.6  PLT 154 143* 152 145* 150   Cardiac Enzymes: No results found for this basename: CKTOTAL, CKMB,  CKMBINDEX, TROPONINI,  in the last 168 hours BNP: BNP (last 3 results)  Recent Labs  12/05/13 1829  PROBNP 44.8   CBG:  Recent Labs Lab 12/07/13 1139 12/07/13 1605 12/07/13 2219 12/08/13 0929 12/08/13 1148  GLUCAP 350* 237* 304* 308* 282*       Signed:  Jonette MateBURIEV, Buel Molder N  [facility] 12/08/2013, 1:29 PM

## 2013-12-08 NOTE — Plan of Care (Signed)
Problem: Discharge Progression Outcomes Goal: Independent ADLs or Home Health Care Outcome: Progressing D/c to home with wife

## 2013-12-15 ENCOUNTER — Encounter: Payer: Self-pay | Admitting: Adult Health

## 2013-12-15 ENCOUNTER — Ambulatory Visit (INDEPENDENT_AMBULATORY_CARE_PROVIDER_SITE_OTHER): Payer: Medicare Other | Admitting: Adult Health

## 2013-12-15 VITALS — BP 140/72 | HR 89 | Temp 98.3°F | Ht 71.0 in | Wt 268.0 lb

## 2013-12-15 DIAGNOSIS — J449 Chronic obstructive pulmonary disease, unspecified: Secondary | ICD-10-CM

## 2013-12-15 DIAGNOSIS — J4489 Other specified chronic obstructive pulmonary disease: Secondary | ICD-10-CM

## 2013-12-15 DIAGNOSIS — G4733 Obstructive sleep apnea (adult) (pediatric): Secondary | ICD-10-CM

## 2013-12-15 NOTE — Assessment & Plan Note (Signed)
Previous diagnosis of sleep apnea, currently off of CPAP Will discuss on return .

## 2013-12-15 NOTE — Progress Notes (Signed)
   Subjective:    Patient ID: Joe Palmer, male    DOB: February 25, 1941, 73 y.o.   MRN: [id number]  HPI 73 yo male former smoker with known hx of COPD  Hx of OSA -not on CPAP (previously on CPAP -lost 25lb stopped wearing it)   12/15/2013 Post Hospital follow up  Patient was admitted March 23 through March 26 for COPD, exacerbation. He was treated with IV steroids, bronchodilators, and antibiotics. Patient did have some right hemodynamic, elevation. A sniff test was negative for diaphragm paralysis. Patient says since discharge. He is feeling improved with decreased cough and congestion. He feels that he is getting year. His baseline Has few days left of prednisone  Patient says that his COPD he has been managed by his primary care physician and was on Advair and prednisone. Prior to admission . He denies any hemoptysis, orthopnea, PND, or increased leg swelling.   Review of Systems Constitutional:   No  weight loss, night sweats,  Fevers, chills, fatigue, or  lassitude.  HEENT:   No headaches,  Difficulty swallowing,  Tooth/dental problems, or  Sore throat,                No sneezing, itching, ear ache,  +nasal congestion, post nasal drip,   CV:  No chest pain,  Orthopnea, PND, swelling in lower extremities, anasarca, dizziness, palpitations, syncope.   GI  No heartburn, indigestion, abdominal pain, nausea, vomiting, diarrhea, change in bowel habits, loss of appetite, bloody stools.   Resp:  .  No chest wall deformity  Skin: no rash or lesions.  GU: no dysuria, change in color of urine, no urgency or frequency.  No flank pain, no hematuria   MS:  No joint pain or swelling.  No decreased range of motion.  No back pain.  Psych:  No change in mood or affect. No depression or anxiety.  No memory loss.         Objective:   Physical Exam GEN: A/Ox3; pleasant , NAD, obese   HEENT:  [place]/AT,  EACs-clear, TMs-wnl, NOSE-clear, THROAT-clear, no lesions, no postnasal drip or exudate  noted.   NECK:  Supple w/ fair ROM; no JVD; normal carotid impulses w/o bruits; no thyromegaly or nodules palpated; no lymphadenopathy.  RESP  Diminshed BS in bases, no accessory muscle use, no dullness to percussion  CARD:  RRR, no m/r/g  , no peripheral edema, pulses intact, no cyanosis or clubbing.  GI:   Soft & nt; nml bowel sounds; no organomegaly or masses detected.  Musco: Warm bil, no deformities or joint swelling noted.   Neuro: alert, no focal deficits noted.    Skin: Warm, no lesions or rashes         Assessment & Plan:

## 2013-12-15 NOTE — Assessment & Plan Note (Signed)
Recent exacerbation Patient needs PFT.  Plan  Finish Prednisone taper as directed.  Continue on Advair 250 and Spiriva  follow up Dr. Maple HudsonYoung  In 4 weeks with PFT and As needed   Please contact office for sooner follow up if symptoms do not improve or worsen or seek emergency care

## 2013-12-15 NOTE — Patient Instructions (Signed)
Finish Prednisone taper as directed.  Continue on Advair 250 and Spiriva  follow up Dr. Maple HudsonYoung  In 4 weeks with PFT and As needed   Please contact office for sooner follow up if symptoms do not improve or worsen or seek emergency care

## 2014-01-23 ENCOUNTER — Encounter: Payer: Self-pay | Admitting: Internal Medicine

## 2014-01-23 ENCOUNTER — Ambulatory Visit (INDEPENDENT_AMBULATORY_CARE_PROVIDER_SITE_OTHER): Payer: Medicare Other | Admitting: Internal Medicine

## 2014-01-23 ENCOUNTER — Encounter (INDEPENDENT_AMBULATORY_CARE_PROVIDER_SITE_OTHER): Payer: Self-pay

## 2014-01-23 VITALS — BP 120/70 | HR 87 | Ht 72.0 in | Wt 268.0 lb

## 2014-01-23 DIAGNOSIS — J438 Other emphysema: Secondary | ICD-10-CM

## 2014-01-23 DIAGNOSIS — J439 Emphysema, unspecified: Secondary | ICD-10-CM

## 2014-01-23 DIAGNOSIS — G4733 Obstructive sleep apnea (adult) (pediatric): Secondary | ICD-10-CM

## 2014-01-23 MED ORDER — FLUTICASONE-SALMETEROL 250-50 MCG/DOSE IN AEPB: INHALATION_SPRAY | RESPIRATORY_TRACT | Status: DC

## 2014-01-23 NOTE — Patient Instructions (Signed)
Order reschedule PFT   Dx COPD  Script sent to change Advair to 250      Please call as needed

## 2014-01-23 NOTE — Progress Notes (Signed)
Subjective:    Patient ID: Joe Palmer, male    DOB: [birth year], 73 y.o.   MRN: [id number]  HPI 73 yo male former smoker with known hx of COPD  Hx of OSA -not on CPAP (previously on CPAP -lost 25lb stopped wearing it)   12/15/2013 Post Hospital follow up  Patient was admitted March 23 through March 26 for COPD, exacerbation. He was treated with IV steroids, bronchodilators, and antibiotics. Patient did have some right hemodynamic, elevation. A sniff test was negative for diaphragm paralysis. Patient says since discharge. He is feeling improved with decreased cough and congestion. He feels that he is getting year. His baseline Has few days left of prednisone  Patient says that his COPD he has been managed by his primary care physician and was on Advair and prednisone. Prior to admission . He denies any hemoptysis, orthopnea, PND, or increased leg swelling.  01/23/14- 73 yoM former smoker (1ppd 40 pk yrs) re-establishing for COPD Former patient-last seen 2010; saw TP 12-15-13 for HFU; COPD Hx OSA- stopped CPAP after weight loss. He had worn CPAP 12/Advanced based on a sleep study 02/15/02 AHI 62/hr, but stopped after he lost 25 pounds and felt he no longer needed. He says he is comfortable without it and getting no complaints about snoring. His main concern now is dyspnea with known history of COPD. He had to be hospitalized for dyspnea after [place] the [place] during peak pollen season. Now baseline daily cough with occasional scant white phlegm. Using nebulizer every other day, occasional rescue inhaler, Advair and Spiriva. Flonase for rhinitis. Quit smoking 2002. He denies cardiac history but is diabetic. CXR 12/05/13 IMPRESSION:  1. Elevated right hemidiaphragm. Sniff test showed mobile, weak diaphragm with eventration 2. Thoracic spondylosis.  Electronically Signed  By: Herbie Baltimore M.D.  On: 12/05/2013 19:51  Prior to Admission medications   Medication Sig Start Date End Date  Taking? Authorizing Provider  albuterol (PROVENTIL HFA;VENTOLIN HFA) 108 (90 BASE) MCG/ACT inhaler Inhale 2 puffs into the lungs every 6 (six) hours as needed for wheezing or shortness of breath.   Yes Historical Provider, MD  albuterol (PROVENTIL) (2.5 MG/3ML) 0.083% nebulizer solution Inhale 3 mLs into the lungs every 6 (six) hours as needed for wheezing or shortness of breath.  11/04/13  Yes Historical Provider, MD  cetirizine (ZYRTEC) 10 MG tablet Take 10 mg by mouth daily as needed for allergies.   Yes Historical Provider, MD  fluticasone (FLONASE) 50 MCG/ACT nasal spray Place 1 spray into both nostrils daily. 10/05/13  Yes Historical Provider, MD  ibuprofen (ADVIL,MOTRIN) 200 MG tablet Take 800 mg by mouth every 6 (six) hours as needed for moderate pain.   Yes Historical Provider, MD  JANUVIA 100 MG tablet Take 100 mg by mouth daily. 11/07/13  Yes Historical Provider, MD  LANTUS SOLOSTAR 100 UNIT/ML Solostar Pen Inject 40 Units into the skin at bedtime. 11/30/13  Yes Historical Provider, MD  metFORMIN (GLUCOPHAGE) 850 MG tablet Take 850-1,700 mg by mouth 2 (two) times daily. Takes 1 tab in am, then 2 tabs throughout the rest of the day 10/12/13  Yes Historical Provider, MD  oxyCODONE-acetaminophen (PERCOCET) 10-325 MG per tablet Take 1 tablet by mouth every 6 (six) hours as needed for pain.  11/28/13  Yes Historical Provider, MD  SPIRIVA HANDIHALER 18 MCG inhalation capsule Place 1 capsule into inhaler and inhale daily. 09/19/13  Yes Historical Provider, MD  Fluticasone-Salmeterol (ADVAIR DISKUS) 250-50 MCG/DOSE AEPB 1 puff then rinse  mouth twice daily 01/23/14   Waymon Budgelinton D Young, MD   Past Medical History  Diagnosis Date  . Diabetes mellitus without complication   . Asthma   . Sleep apnea   . COPD (chronic obstructive pulmonary disease)    Past Surgical History  Procedure Laterality Date  . Tonsillectomy    . Vasectomy    . Back surgery    . Cataract surgery    . Knee surgery    . Replacement  total knee bilateral     Family History  Problem Relation Age of Onset  . Allergies Mother    History   Social History  . Marital Status: Married    Spouse Name: N/A    Number of Children: 3  . Years of Education: N/A   Occupational History  . retired    Social History Main Topics  . Smoking status: Former Smoker -- 1.00 packs/day for 40 years    Types: Cigarettes    Quit date: 09/15/2000  . Smokeless tobacco: Not on file  . Alcohol Use: Yes     Comment: socially  . Drug Use: No  . Sexual Activity: Not on file   Other Topics Concern  . Not on file   Social History Narrative  . No narrative on file   ROS-see HPI Constitutional:   No-   weight loss, night sweats, fevers, chills, fatigue, lassitude. HEENT:   No-  headaches, difficulty swallowing, tooth/dental problems, sore throat,       No-  sneezing, itching, ear ache, nasal congestion, post nasal drip,  CV:  No-   chest pain, orthopnea, PND, swelling in lower extremities, anasarca,                                  dizziness, palpitations Resp: +  shortness of breath with exertion or at rest.              + productive cough,  +non-productive cough,  No- coughing up of blood.              No-   change in color of mucus.  No- wheezing.   Skin: No-   rash or lesions. GI:  No-   heartburn, indigestion, abdominal pain, nausea, vomiting, diarrhea,                 change in bowel habits, loss of appetite GU: No-   dysuria, change in color of urine, no urgency or frequency.  No- flank pain. MS:  No-   joint pain or swelling.  No- decreased range of motion.  No- back pain. Neuro-     nothing unusual Psych:  No- change in mood or affect. No depression or anxiety.  No memory loss.   Objective:  OBJ- Physical Exam General- Alert, Oriented, Affect-appropriate, Distress- none acute, overweight Skin- rash-none, lesions- none, excoriation- none Lymphadenopathy- none Head- atraumatic            Eyes- Gross vision intact, PERRLA,  conjunctivae and secretions clear            Ears- Hearing, canals-normal            Nose- Clear, no-Septal dev, mucus, polyps, erosion, perforation             Throat- Mallampati II , mucosa clear , drainage- none, tonsils- atrophic, own teeth Neck- flexible , trachea midline, no stridor , thyroid nl, carotid no bruit  Chest - symmetrical excursion , unlabored           Heart/CV- RRR , no murmur , no gallop  , no rub, nl s1 s2                           - JVD- none , edema- none, stasis changes- none, varices- none           Lung- clear to P&A, wheeze- none, cough- none , dullness-none, rub- none           Chest wall-  Abd- tender-no, distended-no, bowel sounds-present, HSM- no Br/ Gen/ Rectal- Not done, not indicated Extrem- +scars bilateral TKR, +superficial varices Neuro- grossly intact to observation    Assessment & Plan:

## 2014-02-27 ENCOUNTER — Encounter (INDEPENDENT_AMBULATORY_CARE_PROVIDER_SITE_OTHER): Payer: Self-pay

## 2014-03-05 ENCOUNTER — Encounter: Payer: Self-pay | Admitting: Internal Medicine

## 2014-03-05 NOTE — Assessment & Plan Note (Addendum)
Quit smoking 2002. Now noticing dyspnea on exertion which he mostly blames on nasal congestion. Plan-educated on use of his bronchodilators. He is having some hoarseness so we are going to stop Advair from 500 back to 250. Emphasized importance of weight loss and regular exercise for stamina. Schedule PFT

## 2014-03-05 NOTE — Assessment & Plan Note (Signed)
He does not consider this to an active issue now although I would be surprised if he lost enough weight to resolve his severe obstructive sleep apnea. Told him we could come back to this issue and asked him to pay attention to symptoms.

## 2014-03-23 ENCOUNTER — Other Ambulatory Visit: Payer: Self-pay | Admitting: Internal Medicine

## 2014-03-23 ENCOUNTER — Ambulatory Visit (INDEPENDENT_AMBULATORY_CARE_PROVIDER_SITE_OTHER): Payer: Medicare Other | Admitting: Internal Medicine

## 2014-03-23 DIAGNOSIS — R0602 Shortness of breath: Secondary | ICD-10-CM

## 2014-03-23 LAB — PULMONARY FUNCTION TEST
DL/VA % PRED: 72 %
DL/VA: 3.33 ml/min/mmHg/L
DLCO UNC % PRED: 47 %
DLCO unc: 15.46 ml/min/mmHg
FEF 25-75 Post: 1.54 L/sec
FEF 25-75 Pre: 1.15 L/sec
FEF2575-%CHANGE-POST: 34 %
FEF2575-%PRED-POST: 69 %
FEF2575-%PRED-PRE: 52 %
FEV1-%Change-Post: 7 %
FEV1-%PRED-POST: 63 %
FEV1-%Pred-Pre: 59 %
FEV1-Post: 1.94 L
FEV1-Pre: 1.8 L
FEV1FVC-%Change-Post: 4 %
FEV1FVC-%Pred-Pre: 98 %
FEV6-%CHANGE-POST: 3 %
FEV6-%PRED-PRE: 63 %
FEV6-%Pred-Post: 65 %
FEV6-POST: 2.59 L
FEV6-Pre: 2.51 L
FEV6FVC-%PRED-POST: 106 %
FEV6FVC-%Pred-Pre: 106 %
FVC-%CHANGE-POST: 3 %
FVC-%PRED-PRE: 59 %
FVC-%Pred-Post: 61 %
FVC-PRE: 2.51 L
FVC-Post: 2.59 L
PRE FEV1/FVC RATIO: 72 %
Post FEV1/FVC ratio: 75 %
Post FEV6/FVC ratio: 100 %
Pre FEV6/FVC Ratio: 100 %
RV % pred: 68 %
RV: 1.74 L
TLC % pred: 65 %
TLC: 4.59 L

## 2014-03-23 NOTE — Progress Notes (Signed)
PFT done today. 

## 2014-05-24 ENCOUNTER — Ambulatory Visit (INDEPENDENT_AMBULATORY_CARE_PROVIDER_SITE_OTHER): Payer: Medicare Other | Admitting: Neurology

## 2014-05-24 ENCOUNTER — Encounter: Payer: Self-pay | Admitting: Neurology

## 2014-05-24 VITALS — BP 142/76 | HR 100 | Temp 98.0°F | Resp 18 | Ht 72.0 in | Wt 267.1 lb

## 2014-05-24 DIAGNOSIS — G510 Bell's palsy: Secondary | ICD-10-CM

## 2014-05-24 DIAGNOSIS — E1165 Type 2 diabetes mellitus with hyperglycemia: Secondary | ICD-10-CM

## 2014-05-24 DIAGNOSIS — IMO0002 Reserved for concepts with insufficient information to code with codable children: Secondary | ICD-10-CM

## 2014-05-24 DIAGNOSIS — IMO0001 Reserved for inherently not codable concepts without codable children: Secondary | ICD-10-CM

## 2014-05-24 NOTE — Progress Notes (Signed)
NEUROLOGY CONSULTATION NOTE  MEGAN HAYDUK MRN: [id number] DOB: 1940-10-06  Referring provider: Dr. Valentina Lucks Primary care provider: Dr. Valentina Lucks  Reason for consult:  Bell's palsy  HISTORY OF PRESENT ILLNESS: Joe Palmer is a 73 year old right-handed man with history of COPD, OSA, asthma and type II diabetes who presents for Bell's palsy.  He is accompanied by his wife.  Outside records reviewed.  Last month, he woke up with left upper facial weakness.  Over the next 2 days, it progressed to severe left sided upper and lower facial weakness.  He also noted pain in the left ear, as well as taste abnormality.  Food and drink would spill out of his mouth.  There was no associated unilateral numbness or weakness of the body or headache.  No hyperacusis.  He denied any prior viral illness.  He went to the walk in clinic and was prescribed Valtrex.  A week later, he followed up with his PCP, who prescribed him prednisone taper.  He followed up with ophthalmologist who has noted no abnormalities.  He has been using eye drops and lacrilube.  Over the past month, there has been no improvement in symptoms but it has not gotten worse either.   Labs from 02/22/14 include Hgb A1c of 8.1 with estimated average glucose level of 186.    PAST MEDICAL HISTORY: Past Medical History  Diagnosis Date  . Diabetes mellitus without complication   . Asthma   . Sleep apnea   . COPD (chronic obstructive pulmonary disease)     PAST SURGICAL HISTORY: Past Surgical History  Procedure Laterality Date  . Tonsillectomy    . Vasectomy    . Back surgery    . Cataract surgery    . Knee surgery    . Replacement total knee bilateral      MEDICATIONS: Current Outpatient Prescriptions on File Prior to Visit  Medication Sig Dispense Refill  . albuterol (PROVENTIL HFA;VENTOLIN HFA) 108 (90 BASE) MCG/ACT inhaler Inhale 2 puffs into the lungs every 6 (six) hours as needed for wheezing or shortness of breath.        Marland Kitchen albuterol (PROVENTIL) (2.5 MG/3ML) 0.083% nebulizer solution Inhale 3 mLs into the lungs every 6 (six) hours as needed for wheezing or shortness of breath.       . cetirizine (ZYRTEC) 10 MG tablet Take 10 mg by mouth daily as needed for allergies.      . fluticasone (FLONASE) 50 MCG/ACT nasal spray Place 1 spray into both nostrils daily.      . Fluticasone-Salmeterol (ADVAIR DISKUS) 250-50 MCG/DOSE AEPB 1 puff then rinse mouth twice daily  60 each  prn  . ibuprofen (ADVIL,MOTRIN) 200 MG tablet Take 800 mg by mouth every 6 (six) hours as needed for moderate pain.      Marland Kitchen JANUVIA 100 MG tablet Take 100 mg by mouth daily.      Marland Kitchen LANTUS SOLOSTAR 100 UNIT/ML Solostar Pen Inject 40 Units into the skin at bedtime.      . metFORMIN (GLUCOPHAGE) 850 MG tablet Take 850-1,700 mg by mouth 2 (two) times daily. Takes 1 tab in am, then 2 tabs throughout the rest of the day      . oxyCODONE-acetaminophen (PERCOCET) 10-325 MG per tablet Take 1 tablet by mouth every 6 (six) hours as needed for pain.       Marland Kitchen SPIRIVA HANDIHALER 18 MCG inhalation capsule Place 1 capsule into inhaler and inhale daily.  No current facility-administered medications on file prior to visit.    ALLERGIES: Allergies  Allergen Reactions  . Sulfa Antibiotics Other (See Comments)    unknown    FAMILY HISTORY: Family History  Problem Relation Age of Onset  . Allergies Mother   . Diabetes Mother     SOCIAL HISTORY: History   Social History  . Marital Status: Married    Spouse Name: N/A    Number of Children: 3  . Years of Education: N/A   Occupational History  . retired    Social History Main Topics  . Smoking status: Former Smoker -- 1.00 packs/day for 40 years    Types: Cigarettes    Quit date: 09/15/2000  . Smokeless tobacco: Not on file  . Alcohol Use: Yes     Comment: socially  . Drug Use: No  . Sexual Activity: Yes    Partners: Female   Other Topics Concern  . Not on file   Social History Narrative   . No narrative on file    REVIEW OF SYSTEMS: Constitutional: No fevers, chills, or sweats, no generalized fatigue, change in appetite Eyes: No visual changes, double vision, eye pain Ear, nose and throat: No hearing loss, ear pain, nasal congestion, sore throat Cardiovascular: No chest pain, palpitations Respiratory:  No shortness of breath at rest or with exertion, wheezes GastrointestinaI: No nausea, vomiting, diarrhea, abdominal pain, fecal incontinence Genitourinary:  No dysuria, urinary retention or frequency Musculoskeletal:  No neck pain, back pain Integumentary: No rash, pruritus, skin lesions Neurological: as above Psychiatric: No depression, insomnia, anxiety Endocrine: No palpitations, fatigue, diaphoresis, mood swings, change in appetite, change in weight, increased thirst Hematologic/Lymphatic:  No anemia, purpura, petechiae. Allergic/Immunologic: no itchy/runny eyes, nasal congestion, recent allergic reactions, rashes  PHYSICAL EXAM: Filed Vitals:   05/24/14 1444  BP: 142/76  Pulse: 100  Temp: 98 F (36.7 C)  Resp: 18   General: No acute distress Head:  Normocephalic/atraumatic, no mass lesions palpated on face, head or neck. Neck: supple, no paraspinal tenderness, full range of motion Back: No paraspinal tenderness Heart: regular rate and rhythm Lungs: Clear to auscultation bilaterally. Vascular: No carotid bruits. Neurological Exam: Mental status: alert and oriented to person, place, and time, recent and remote memory intact, fund of knowledge intact, attention and concentration intact, speech fluent and not dysarthric, language intact. Cranial nerves: CN I: not tested CN II: pupils equal, round and reactive to light, visual fields intact, fundi not visualized CN III, IV, VI:  full range of motion, no nystagmus, no ptosis CN V: facial sensation intact (although sensation feels "tight" on left cheek) CN VII: left upper and lower facial weakness CN VIII:  hearing intact CN IX, X: gag intact, uvula midline CN XI: sternocleidomastoid and trapezius muscles intact CN XII: tongue mildly towards the right. Bulk & Tone: normal, no fasciculations. Motor: 5/5 throughout Sensation: reduced pinprick sensation in feet up to ankles.  Vibration intact. Deep Tendon Reflexes: 2+ in upper extremities, absent in lower extremities, toes downgoing. Finger to nose testing: no dysmetria Heel to shin: no dysmetria Gait: normal station and stride. Romberg negative.  IMPRESSION: Left Bell's palsy.  Based on history and my exam, I do not see anything to suggest another etiology.    PLAN: It is really too soon to look for other etiologies.  It often can take 3 to 6 months for recovery.  I usually like to give it at least 3 months before I perform other tests to look for  another etiology.  Therefore, I recommend following up in 2 months or so for a re-evaluation.  If he notices worsening symptoms, he is instructed to call and we can perform other tests.  Often, it may take 3-6 months before resolution of symptoms, sometimes up to one year.  Depending on severity, some people may have residual symptoms.  Diabetes may complicate things as well.  Advised to protect his eye with the eye drops during the day, the lacrilube at night and consider an eye patch at night.   45 minutes spent with patient, over 50% spent counseling and coordinating care.  Thank you for allowing me to take part in the care of this patient.  Shon Millet, DO  CC:  Astrid Divine, MD

## 2014-05-24 NOTE — Patient Instructions (Addendum)
Based on my exam and the history you provided, it seems like Bell's palsy.  I usually like to give it at least 3 months before I perform other tests to look for another etiology.  Therefore, I recommend following up in 2 months or so for a re-evaluation.  If you notice any worsening symptoms, please call and we can perform other tests.  Often, it may take 3-6 months before resolution of symptoms, sometimes up to one year.  Depending on severity, some people may have residual symptoms.  Diabetes may complicate things as well.  Be sure to protect your eye with the eye drops during the day, the lacrilube at night and consider an eye patch at night.

## 2014-07-10 ENCOUNTER — Telehealth: Payer: Self-pay | Admitting: Neurology

## 2014-07-10 NOTE — Telephone Encounter (Signed)
Pt resch appt from 08-02-14 to 08-08-14

## 2014-07-28 ENCOUNTER — Ambulatory Visit: Payer: Medicare Other | Admitting: Internal Medicine

## 2014-08-02 ENCOUNTER — Ambulatory Visit: Payer: Medicare Other | Admitting: Neurology

## 2014-08-08 ENCOUNTER — Ambulatory Visit (INDEPENDENT_AMBULATORY_CARE_PROVIDER_SITE_OTHER): Payer: Medicare Other | Admitting: Neurology

## 2014-08-08 ENCOUNTER — Encounter: Payer: Self-pay | Admitting: Neurology

## 2014-08-08 ENCOUNTER — Other Ambulatory Visit: Payer: Self-pay | Admitting: Family Medicine

## 2014-08-08 VITALS — BP 140/78 | HR 92 | Ht 72.0 in | Wt 269.5 lb

## 2014-08-08 DIAGNOSIS — R609 Edema, unspecified: Secondary | ICD-10-CM

## 2014-08-08 DIAGNOSIS — IMO0002 Reserved for concepts with insufficient information to code with codable children: Secondary | ICD-10-CM

## 2014-08-08 DIAGNOSIS — E1165 Type 2 diabetes mellitus with hyperglycemia: Secondary | ICD-10-CM

## 2014-08-08 DIAGNOSIS — G510 Bell's palsy: Secondary | ICD-10-CM

## 2014-08-08 NOTE — Progress Notes (Signed)
NEUROLOGY FOLLOW UP OFFICE NOTE  Freddi StarrRussell C Madlock [id number]  HISTORY OF PRESENT ILLNESS: Joe Palmer is a 73 year old right-handed man with history of COPD, OSA, asthma and type II diabetes who follows up for Bell's palsy.  He is accompanied by his wife.  UPDATE: He and his wife have not noticed any difference but people who don't see him everyday have noted some mild improvement.  He still cannot close his eye.  He uses the eye patch at night, as well as LacriLube and drops.Marland Kitchen.  HISTORY: In August, he woke up with left upper facial weakness.  Over the next 2 days, it progressed to severe left sided upper and lower facial weakness.  He also noted pain in the left ear, as well as taste abnormality.  Food and drink would spill out of his mouth.  There was no associated unilateral numbness or weakness of the body or headache.  No hyperacusis.  He denied any prior viral illness.  He went to the walk in clinic and was prescribed Valtrex.  A week later, he followed up with his PCP, who prescribed him prednisone taper.  He followed up with ophthalmologist who has noted no abnormalities.  He has been using eye drops and lacrilube.  Over the past month, there has been no improvement in symptoms but it has not gotten worse either.   Labs from 02/22/14 include Hgb A1c of 8.1 with estimated average glucose level of 186.   PAST MEDICAL HISTORY: Past Medical History  Diagnosis Date  . Diabetes mellitus without complication   . Asthma   . Sleep apnea   . COPD (chronic obstructive pulmonary disease)     MEDICATIONS: Current Outpatient Prescriptions on File Prior to Visit  Medication Sig Dispense Refill  . albuterol (PROVENTIL HFA;VENTOLIN HFA) 108 (90 BASE) MCG/ACT inhaler Inhale 2 puffs into the lungs every 6 (six) hours as needed for wheezing or shortness of breath.    Marland Kitchen. albuterol (PROVENTIL) (2.5 MG/3ML) 0.083% nebulizer solution Inhale 3 mLs into the lungs every 6 (six) hours as needed for  wheezing or shortness of breath.     . cetirizine (ZYRTEC) 10 MG tablet Take 10 mg by mouth daily as needed for allergies.    . fluticasone (FLONASE) 50 MCG/ACT nasal spray Place 1 spray into both nostrils daily.    . Fluticasone-Salmeterol (ADVAIR DISKUS) 250-50 MCG/DOSE AEPB 1 puff then rinse mouth twice daily 60 each prn  . ibuprofen (ADVIL,MOTRIN) 200 MG tablet Take 800 mg by mouth every 6 (six) hours as needed for moderate pain.    Marland Kitchen. LANTUS SOLOSTAR 100 UNIT/ML Solostar Pen Inject 40 Units into the skin at bedtime.    . metFORMIN (GLUCOPHAGE) 850 MG tablet Take 850-1,700 mg by mouth 2 (two) times daily. Takes 1 tab in am, then 2 tabs throughout the rest of the day    . oxyCODONE-acetaminophen (PERCOCET) 10-325 MG per tablet Take 1 tablet by mouth every 6 (six) hours as needed for pain.     Marland Kitchen. SPIRIVA HANDIHALER 18 MCG inhalation capsule Place 1 capsule into inhaler and inhale daily.     No current facility-administered medications on file prior to visit.    ALLERGIES: Allergies  Allergen Reactions  . Sulfa Antibiotics Other (See Comments)    unknown    FAMILY HISTORY: Family History  Problem Relation Age of Onset  . Allergies Mother   . Diabetes Mother     SOCIAL HISTORY: History   Social History  .  Marital Status: Married    Spouse Name: N/A    Number of Children: 3  . Years of Education: N/A   Occupational History  . Not on file.   Social History Main Topics  . Smoking status: Former Smoker -- 1.00 packs/day for 40 years    Types: Cigarettes    Quit date: 09/15/2000  . Smokeless tobacco: Not on file  . Alcohol Use: 0.0 oz/week    0 Not specified per week     Comment: socially  . Drug Use: No  . Sexual Activity:    Partners: Female   Other Topics Concern  . Not on file   Social History Narrative   Lives with wife in a one story home.  Retired from [employer] business.  Has 3 children.      REVIEW OF SYSTEMS: Constitutional: No fevers, chills, or sweats,  no generalized fatigue, change in appetite Eyes: No visual changes, double vision, eye pain Ear, nose and throat: No hearing loss, ear pain, nasal congestion, sore throat Cardiovascular: No chest pain, palpitations Respiratory:  No shortness of breath at rest or with exertion, wheezes GastrointestinaI: No nausea, vomiting, diarrhea, abdominal pain, fecal incontinence Genitourinary:  No dysuria, urinary retention or frequency Musculoskeletal:  No neck pain, back pain Integumentary: No rash, pruritus, skin lesions Neurological: as above Psychiatric: No depression, insomnia, anxiety Endocrine: No palpitations, fatigue, diaphoresis, mood swings, change in appetite, change in weight, increased thirst Hematologic/Lymphatic:  No anemia, purpura, petechiae. Allergic/Immunologic: no itchy/runny eyes, nasal congestion, recent allergic reactions, rashes  PHYSICAL EXAM: Filed Vitals:   08/08/14 1321  BP: 140/78  Pulse: 92   General: No acute distress Head:  Normocephalic/atraumatic Eyes:  Fundoscopic exam unremarkable without vessel changes, exudates, hemorrhages or papilledema. Neck: supple, no paraspinal tenderness, full range of motion Heart:  Regular rate and rhythm Lungs:  Clear to auscultation bilaterally Back: No paraspinal tenderness Neurological Exam: alert and oriented to person, place, and time. Attention span and concentration intact, recent and remote memory intact, fund of knowledge intact.  Speech fluent and not dysarthric, language intact.  Left upper and lower facial weakness.  Reduced hearing bilaterally.  Otherwise, CN II-XII intact. Fundoscopic exam unremarkable without vessel changes, exudates, hemorrhages or papilledema.  Bulk and tone normal, muscle strength 5/5 throughout.  Finger to nose intact.  Gait normal  IMPRESSION: Left Bell's palsy.  Prolonged recovery, likely complicated by uncontrolled diabetes. Uncontrolled diabetes mellitus, type II  PLAN: Discussed workup,  including MRI of the brain and facial nerve with and without contrast, as well as checking labs for ANA, Sed Rate, ACE, Lyme, ANCA.  At this point, they have decided to wait and re-evaluate in another 2 months.  At that point, if there hasn't been significant improvement, then we would pursue workup for other etiologies.  15 minutes spent with patient, over 50% spent discussing etiologies and coordinating care.  Shon MilletAdam Bobbye Reinitz, DO  CC:  Maurice SmallElaine Griffin, MD

## 2014-08-08 NOTE — Patient Instructions (Signed)
I would like to wait and see you in two months.  If there is no significant improvement, then we can pursue other testing.

## 2014-08-09 ENCOUNTER — Ambulatory Visit
Admission: RE | Admit: 2014-08-09 | Discharge: 2014-08-09 | Disposition: A | Discharge status: Home / Self Care | Payer: Medicare Other | Source: Ambulatory Visit | Attending: Family Medicine | Admitting: Family Medicine

## 2014-08-09 DIAGNOSIS — R609 Edema, unspecified: Secondary | ICD-10-CM

## 2014-08-09 NOTE — Progress Notes (Signed)
Note faxed.

## 2014-09-11 ENCOUNTER — Ambulatory Visit (INDEPENDENT_AMBULATORY_CARE_PROVIDER_SITE_OTHER): Payer: Medicare Other | Admitting: Internal Medicine

## 2014-09-11 ENCOUNTER — Encounter: Payer: Self-pay | Admitting: Internal Medicine

## 2014-09-11 VITALS — BP 122/62 | HR 96 | Ht 72.0 in | Wt 258.6 lb

## 2014-09-11 DIAGNOSIS — J449 Chronic obstructive pulmonary disease, unspecified: Secondary | ICD-10-CM

## 2014-09-11 DIAGNOSIS — G4733 Obstructive sleep apnea (adult) (pediatric): Secondary | ICD-10-CM

## 2014-09-11 NOTE — Patient Instructions (Signed)
We can continue present meds  Please call as needed 

## 2014-09-11 NOTE — Progress Notes (Signed)
Subjective:    Patient ID: Joe Palmer, male    DOB: March 28, 1941, 73 y.o.   MRN: [id number]  HPI 73 yo male former smoker with known hx of COPD  Hx of OSA -not on CPAP (previously on CPAP -lost 25lb stopped wearing it)   12/15/2013 Post Hospital follow up  Patient was admitted March 23 through March 26 for COPD, exacerbation. He was treated with IV steroids, bronchodilators, and antibiotics. Patient did have some right hemodynamic, elevation. A sniff test was negative for diaphragm paralysis. Patient says since discharge. He is feeling improved with decreased cough and congestion. He feels that he is getting year. His baseline Has few days left of prednisone  Patient says that his COPD he has been managed by his primary care physician and was on Advair and prednisone. Prior to admission . He denies any hemoptysis, orthopnea, PND, or increased leg swelling.  01/23/14- 73 yoM former smoker (1ppd 40 pk yrs) re-establishing for COPD Former patient-last seen 2010; saw TP 12-15-13 for HFU; COPD Hx OSA- stopped CPAP after weight loss. He had worn CPAP 12/Advanced based on a sleep study 02/15/02 AHI 62/hr, but stopped after he lost 25 pounds and felt he no longer needed. He says he is comfortable without it and getting no complaints about snoring. His main concern now is dyspnea with known history of COPD. He had to be hospitalized for dyspnea after a [place] the [place] during peak pollen season. Now baseline daily cough with occasional scant white phlegm. Using nebulizer every other day, occasional rescue inhaler, Advair and Spiriva. Flonase for rhinitis. Quit smoking 2002. He denies cardiac history but is diabetic. CXR 12/05/13 IMPRESSION:  1. Elevated right hemidiaphragm. Sniff test showed mobile, weak diaphragm with eventration 2. Thoracic spondylosis.  Electronically Signed  By: Herbie BaltimoreWalt Liebkemann M.D.  On: 12/05/2013 19:51  09/11/14- 73 yoM former smoker (1ppd 40 pk yrs) re-establishing for  COPD saw TP 12-15-13 for HFU; COPD FOLLOWS FOR: recently getting over bronchitis otherwise doing well overall;   PFT-03-2014: Moderately severe obstructive airways disease with slight response to bronchodilator, moderately severe restriction and severe diffusion defect. FVC 2.59/61%, FEV1 1.94/63%, FEV1/FVC 0.75, TLC 65%, DLCO 47%. Had treatment for cellulitis of legs and now okay except for some residual edema. Working with therapist. Developed Bell's palsy with left facial droop. Chest x-ray-elevated right hemidiaphragm.  ROS-see HPI Constitutional:   No-   weight loss, night sweats, fevers, chills, fatigue, lassitude. HEENT:   No-  headaches, difficulty swallowing, tooth/dental problems, sore throat,       No-  sneezing, itching, ear ache, nasal congestion, post nasal drip,  CV:  No-   chest pain, orthopnea, PND, +swelling in lower extremities, anasarca,                                  dizziness, palpitations Resp: +  shortness of breath with exertion or at rest.              + productive cough,  +non-productive cough,  No- coughing up of blood.              No-   change in color of mucus.  No- wheezing.   Skin: No-   rash or lesions. GI:  No-   heartburn, indigestion, abdominal pain, nausea, vomiting, diarrhea,                 change in bowel  habits, loss of appetite GU: No-   dysuria, change in color of urine, no urgency or frequency.  No- flank pain. MS:  No-   joint pain or swelling.  No- decreased range of motion.  No- back pain. Neuro-   + Bell's palsy with left facial droop Psych:  No- change in mood or affect. No depression or anxiety.  No memory loss.   Objective:  OBJ- Physical Exam General- Alert, Oriented, Affect-appropriate, Distress- none acute, +overweight Skin- rash-none, lesions- none, excoriation- none Lymphadenopathy- none Head- atraumatic            Eyes- Gross vision intact, PERRLA, conjunctivae and secretions clear            Ears- Hearing, canals-normal             Nose- Clear, no-Septal dev, mucus, polyps, erosion, perforation             Throat- Mallampati II , mucosa clear , drainage- none, tonsils- atrophic, own teeth Neck- flexible , trachea midline, no stridor , thyroid nl, carotid no bruit Chest - symmetrical excursion , unlabored           Heart/CV- RRR , no murmur , no gallop  , no rub, nl s1 s2                           - JVD- none , edema- none, stasis changes- none, varices- none           Lung- + few rhonchi left mid chest, unlabored, wheeze- none, cough- none , dullness-none, rub- none           Chest wall-  Abd- tender-no, distended-no, bowel sounds-present, HSM- no Br/ Gen/ Rectal- Not done, not indicated Extrem- +scars bilateral TKR, +superficial varices Neuro- + Bell's palsy with left facial droop    Assessment & Plan:

## 2014-09-16 NOTE — Assessment & Plan Note (Signed)
He is not interested in pursuing this. Advised effort to keep weight down, sleep off flat of back.

## 2014-09-16 NOTE — Assessment & Plan Note (Signed)
Recent acute bronchitis is resolving. Baseline primarily emphysema without much response to bronchodilators. Age, debilitation, and weak hemidiaphragm contribute. Plan-walk as tolerated for endurance

## 2014-10-09 ENCOUNTER — Encounter: Payer: Self-pay | Admitting: Neurology

## 2014-10-09 ENCOUNTER — Ambulatory Visit (INDEPENDENT_AMBULATORY_CARE_PROVIDER_SITE_OTHER): Payer: 59 | Admitting: Neurology

## 2014-10-09 VITALS — BP 98/70 | HR 68 | Temp 98.7°F | Resp 16 | Ht 72.0 in | Wt 251.8 lb

## 2014-10-09 DIAGNOSIS — G510 Bell's palsy: Secondary | ICD-10-CM

## 2014-10-09 NOTE — Patient Instructions (Signed)
You have definitely shown improvement, so I don't think further testing is necessary at this time.  We will continue to see how you do over the next 3 months and follow up in 3 months.  Keep your eye protected and get frequent eye checks.  Call with any questions or concerns.

## 2014-10-09 NOTE — Progress Notes (Signed)
NEUROLOGY FOLLOW UP OFFICE NOTE  Freddi StarrRussell C Rodick [id number]  HISTORY OF PRESENT ILLNESS: Joe Palmer is a 74 year old right-handed man with history of COPD, OSA, asthma and type II diabetes who follows up for Bell's palsy.    UPDATE: There has been improvement in the left sided facial weakness.  His left eye does close, but still weak.  The eye continues to water and uses the patch at night.  HISTORY: In August, he woke up with left upper facial weakness.  Over the next 2 days, it progressed to severe left sided upper and lower facial weakness.  He also noted pain in the left ear, as well as taste abnormality.  Food and drink would spill out of his mouth.  There was no associated unilateral numbness or weakness of the body or headache.  No hyperacusis.  He denied any prior viral illness.  He went to the walk in clinic and was prescribed Valtrex.  A week later, he followed up with his PCP, who prescribed him prednisone taper.  He followed up with ophthalmologist who has noted no abnormalities.  He has been using eye drops and lacrilube.  Over the past month, there has been no improvement in symptoms but it has not gotten worse either.   PAST MEDICAL HISTORY: Past Medical History  Diagnosis Date  . Diabetes mellitus without complication   . Asthma   . Sleep apnea   . COPD (chronic obstructive pulmonary disease)     MEDICATIONS: Current Outpatient Prescriptions on File Prior to Visit  Medication Sig Dispense Refill  . albuterol (PROVENTIL HFA;VENTOLIN HFA) 108 (90 BASE) MCG/ACT inhaler Inhale 2 puffs into the lungs every 6 (six) hours as needed for wheezing or shortness of breath.    Marland Kitchen. albuterol (PROVENTIL) (2.5 MG/3ML) 0.083% nebulizer solution Inhale 3 mLs into the lungs every 6 (six) hours as needed for wheezing or shortness of breath.     . cetirizine (ZYRTEC) 10 MG tablet Take 10 mg by mouth daily as needed for allergies.    . fluticasone (FLONASE) 50 MCG/ACT nasal spray Place 1  spray into both nostrils daily.    . Fluticasone-Salmeterol (ADVAIR DISKUS) 250-50 MCG/DOSE AEPB 1 puff then rinse mouth twice daily 60 each prn  . FREESTYLE LITE test strip     . ibuprofen (ADVIL,MOTRIN) 200 MG tablet Take 800 mg by mouth every 6 (six) hours as needed for moderate pain.    Marland Kitchen. LANTUS SOLOSTAR 100 UNIT/ML Solostar Pen Inject 35 Units into the skin at bedtime.     . metFORMIN (GLUCOPHAGE) 850 MG tablet Takes 1 tab in am, then 2 tabs throughout the rest of the day    . oxyCODONE-acetaminophen (PERCOCET) 10-325 MG per tablet Take 1 tablet by mouth every 6 (six) hours as needed for pain.     Marland Kitchen. SPIRIVA HANDIHALER 18 MCG inhalation capsule Place 1 capsule into inhaler and inhale daily.     No current facility-administered medications on file prior to visit.    ALLERGIES: Allergies  Allergen Reactions  . Sulfa Antibiotics Other (See Comments)    unknown    FAMILY HISTORY: Family History  Problem Relation Age of Onset  . Allergies Mother   . Diabetes Mother     SOCIAL HISTORY: History   Social History  . Marital Status: Married    Spouse Name: N/A    Number of Children: 3  . Years of Education: N/A   Occupational History  . Not on file.  Social History Main Topics  . Smoking status: Former Smoker -- 1.00 packs/day for 40 years    Types: Cigarettes    Quit date: 09/15/2000  . Smokeless tobacco: Never Used  . Alcohol Use: 0.0 oz/week    0 Not specified per week     Comment: socially  . Drug Use: No  . Sexual Activity:    Partners: Female   Other Topics Concern  . Not on file   Social History Narrative   Lives with wife in a one story home.  Retired from [employer].  Has 3 children.      REVIEW OF SYSTEMS: Constitutional: No fevers, chills, or sweats, no generalized fatigue, change in appetite Eyes: No visual changes, double vision, eye pain Ear, nose and throat: No hearing loss, ear pain, nasal congestion, sore throat Cardiovascular: No chest  pain, palpitations Respiratory:  No shortness of breath at rest or with exertion, wheezes GastrointestinaI: No nausea, vomiting, diarrhea, abdominal pain, fecal incontinence Genitourinary:  No dysuria, urinary retention or frequency Musculoskeletal:  No neck pain, back pain Integumentary: No rash, pruritus, skin lesions Neurological: as above Psychiatric: No depression, insomnia, anxiety Endocrine: No palpitations, fatigue, diaphoresis, mood swings, change in appetite, change in weight, increased thirst Hematologic/Lymphatic:  No anemia, purpura, petechiae. Allergic/Immunologic: no itchy/runny eyes, nasal congestion, recent allergic reactions, rashes  PHYSICAL EXAM: Filed Vitals:   10/09/14 1014  BP: 98/70  Pulse: 68  Temp: 98.7 F (37.1 C)  Resp: 16   General: No acute distress Head:  Normocephalic/atraumatic Eyes:  Fundoscopic exam unremarkable without vessel changes, exudates, hemorrhages or papilledema. Neck: supple, no paraspinal tenderness, full range of motion Heart:  Regular rate and rhythm Lungs:  Clear to auscultation bilaterally Back: No paraspinal tenderness Neurological Exam: alert and oriented to person, place, and time. Attention span and concentration intact, recent and remote memory intact, fund of knowledge intact.  Speech fluent and not dysarthric, language intact.  Mild left sided LMN weakness.  Otherwise, CN II-XII intact. Fundoscopic exam unremarkable without vessel changes, exudates, hemorrhages or papilledema.  Bulk and tone normal, muscle strength 5/5 throughout.  Sensation to light touch, temperature and vibration intact.  Deep tendon reflexes 1+ throughout.  Finger to nose testing intact.  Gait normal.  IMPRESSION: Left-sided Bell's palsy.  He definitely has had improvement and face is more symmetric.    PLAN: Since he has had improvement, I don't think further testing is necessary at this time.  We will continue to monitor.  I did advise that he continue  to get eye exams on a more frequent basis (frequency as per the ophthalmologist or optometrist).  He will follow up in 3 months.  15 minutes spent with patient, over 50% spent discussing prognosis.  Shon Millet, DO  CC:  Maurice Small, MD

## 2015-01-01 ENCOUNTER — Other Ambulatory Visit: Payer: Self-pay | Admitting: Dermatology

## 2015-01-08 ENCOUNTER — Ambulatory Visit: Payer: 59 | Admitting: Neurology

## 2015-02-21 ENCOUNTER — Other Ambulatory Visit (INDEPENDENT_AMBULATORY_CARE_PROVIDER_SITE_OTHER): Payer: Medicare Other

## 2015-02-21 ENCOUNTER — Encounter: Payer: Self-pay | Admitting: Neurology

## 2015-02-21 ENCOUNTER — Ambulatory Visit (INDEPENDENT_AMBULATORY_CARE_PROVIDER_SITE_OTHER): Payer: Medicare Other | Admitting: Neurology

## 2015-02-21 VITALS — BP 136/76 | HR 77 | Ht 72.0 in | Wt 255.2 lb

## 2015-02-21 DIAGNOSIS — M79604 Pain in right leg: Secondary | ICD-10-CM

## 2015-02-21 DIAGNOSIS — G510 Bell's palsy: Secondary | ICD-10-CM

## 2015-02-21 DIAGNOSIS — M79605 Pain in left leg: Secondary | ICD-10-CM

## 2015-02-21 DIAGNOSIS — G4761 Periodic limb movement disorder: Secondary | ICD-10-CM

## 2015-02-21 DIAGNOSIS — E118 Type 2 diabetes mellitus with unspecified complications: Secondary | ICD-10-CM | POA: Diagnosis not present

## 2015-02-21 DIAGNOSIS — E1165 Type 2 diabetes mellitus with hyperglycemia: Secondary | ICD-10-CM

## 2015-02-21 LAB — FERRITIN: FERRITIN: 32.9 ng/mL (ref 22.0–322.0)

## 2015-02-21 NOTE — Patient Instructions (Signed)
1.  Will check vascular studies of the legs, as well as ferritin level 2.  If unremarkable, will try a trial of medication used for restless leg syndrome 3.  Follow up in 3 months.  Further testing to be discussed at that time if no improvement.

## 2015-02-21 NOTE — Progress Notes (Signed)
NEUROLOGY FOLLOW UP OFFICE NOTE  Joe Palmer [id number]  HISTORY OF PRESENT ILLNESS: Joe Palmer is a 74 year old right-handed man with history of COPD, OSA, asthma and type II diabetes who follows up for Bell's palsy.    UPDATE: Bell's palsy has significantly improved.  He is here for a different issue.  For over a year, he has been experiencing pain in either leg.  It is a diffuse aching pain of the entire legs.  It is not a shooting radicular pain.  He does have mild mid lower back pain.  He denies numbness or tingling.  He does feel it during the day but it is most pronounced at night.  He will wake up feeling either leg with this discomfort.  He has an urge to move his legs and will get up to walk, which relieves it but does not resolve it.  His wife also notes that his leg will jerk in his sleep.  About a month ago, his legs just gave out.  He saw his orthopedist who ordered imaging of the legs, which were unremarkable.  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  Labs show that his serum glucose levels have been in the 200s-300s.  Last A1c available in EPIC from a year ago was 8.1.  PAST MEDICAL HISTORY: Past Medical History  Diagnosis Date  . Diabetes mellitus without complication   . Asthma   . Sleep apnea   . COPD (chronic obstructive pulmonary disease)     MEDICATIONS: Current Outpatient Prescriptions on File Prior to Visit  Medication Sig Dispense Refill  . albuterol (PROVENTIL HFA;VENTOLIN HFA) 108 (90 BASE) MCG/ACT inhaler Inhale 2 puffs into the lungs every 6 (six) hours as needed for wheezing or shortness of breath.    Marland Kitchen albuterol (PROVENTIL) (2.5 MG/3ML) 0.083% nebulizer solution Inhale 3 mLs into the lungs every 6 (six) hours as needed for wheezing or shortness of breath.     . Blood Glucose Monitoring Suppl (ONE  TOUCH ULTRA 2) W/DEVICE KIT     . cetirizine (ZYRTEC) 10 MG tablet Take 10 mg by mouth daily as needed for allergies.    . fluticasone (FLONASE) 50 MCG/ACT nasal spray Place 1 spray into both nostrils daily.    . Fluticasone-Salmeterol (ADVAIR DISKUS) 250-50 MCG/DOSE AEPB 1 puff then rinse mouth twice daily 60 each prn  . FREESTYLE LITE test strip     . ibuprofen (ADVIL,MOTRIN) 200 MG tablet Take 800 mg by mouth every 6 (six) hours as needed for moderate pain.    Marland Kitchen LANTUS SOLOSTAR 100 UNIT/ML Solostar Pen Inject 35 Units into the skin at bedtime.     . metFORMIN (GLUCOPHAGE) 850 MG tablet Takes 1 tab in am, then 2 tabs throughout the rest of the day    . ONETOUCH DELICA LANCETS FINE MISC     . oxyCODONE-acetaminophen (PERCOCET) 10-325 MG per tablet Take 1 tablet by mouth every 6 (six) hours as needed for pain.     Marland Kitchen oxyCODONE-acetaminophen (PERCOCET/ROXICET) 5-325 MG per tablet     . SPIRIVA HANDIHALER 18 MCG inhalation capsule Place 1 capsule into inhaler and inhale daily.     No current facility-administered medications on file prior to visit.    ALLERGIES: Allergies  Allergen Reactions  . Sulfa Antibiotics Other (See Comments)  unknown    FAMILY HISTORY: Family History  Problem Relation Age of Onset  . Allergies Mother   . Diabetes Mother     SOCIAL HISTORY: History   Social History  . Marital Status: Married    Spouse Name: N/A  . Number of Children: 3  . Years of Education: N/A   Occupational History  . Not on file.   Social History Main Topics  . Smoking status: Former Smoker -- 1.00 packs/day for 40 years    Types: Cigarettes    Quit date: 09/15/2000  . Smokeless tobacco: Never Used  . Alcohol Use: 0.0 oz/week    0 Standard drinks or equivalent per week     Comment: socially  . Drug Use: No  . Sexual Activity:    Partners: Female   Other Topics Concern  . Not on file   Social History Narrative   Lives with wife in a one story home.  Retired from  [employer].  Has 3 children.      REVIEW OF SYSTEMS: Constitutional: No fevers, chills, or sweats, no generalized fatigue, change in appetite Eyes: No visual changes, double vision, eye pain Ear, nose and throat: No hearing loss, ear pain, nasal congestion, sore throat Cardiovascular: No chest pain, palpitations Respiratory:  No shortness of breath at rest or with exertion, wheezes GastrointestinaI: No nausea, vomiting, diarrhea, abdominal pain, fecal incontinence Genitourinary:  No dysuria, urinary retention or frequency Musculoskeletal:  No neck pain, back pain Integumentary: No rash, pruritus, skin lesions Neurological: as above Psychiatric: No depression, insomnia, anxiety Endocrine: No palpitations, fatigue, diaphoresis, mood swings, change in appetite, change in weight, increased thirst Hematologic/Lymphatic:  No anemia, purpura, petechiae. Allergic/Immunologic: no itchy/runny eyes, nasal congestion, recent allergic reactions, rashes  PHYSICAL EXAM: Filed Vitals:   02/21/15 0857  BP: 136/76  Pulse: 77   General: No acute distress Head:  Normocephalic/atraumatic Eyes:  Fundoscopic exam unremarkable without vessel changes, exudates, hemorrhages or papilledema. Neck: supple, no paraspinal tenderness, full range of motion Heart:  Regular rate and rhythm Lungs:  Clear to auscultation bilaterally Back: No paraspinal tenderness Neurological Exam: intact.  Mild left sided LMN weakness.  Otherwise, CN II-XII intact. Fundoscopic exam unremarkable without vessel changes, exudates, hemorrhages or papilledema.  Bulk and tone normal, left foot drop, otherwise muscle strength 5/5 throughout.  Decreased pinprick and vibration over dorsum of left foot.  Deep tendon reflexes 1+ throughout.  Finger to nose testing intact.  Steppage with left foot.  Cannot tandem walk.  Romberg negative.  IMPRESSION: Bilateral leg pain.  More likely restless leg syndrome, neuropathy due to diabetes or  peripheral vascular.  At this point, less likely lumbar stenosis since walking relieves it. Periodic limb movements of sleep Left Bell's palsy, improved Type 2 diabetes mellitus  PLAN: 1.  Will check vascular studies of the legs (ABI), as well as ferritin level 2.  If unremarkable, will try a trial of medication used for restless leg syndrome 3.  Follow up in 3 months.  Further testing to be discussed at that time if no improvement.  Metta Clines, DO  CC: Kelton Pillar, MD

## 2015-02-26 NOTE — Addendum Note (Signed)
Addended by: Samantha Crimes on: 02/26/2015 04:11 PM   Modules accepted: Orders

## 2015-02-28 ENCOUNTER — Other Ambulatory Visit: Payer: Self-pay | Admitting: Neurology

## 2015-02-28 ENCOUNTER — Ambulatory Visit (HOSPITAL_COMMUNITY)
Admission: RE | Admit: 2015-02-28 | Discharge: 2015-02-28 | Disposition: A | Discharge status: Home / Self Care | Payer: Medicare Other | Source: Ambulatory Visit | Attending: Vascular Surgery | Admitting: Vascular Surgery

## 2015-02-28 DIAGNOSIS — M79605 Pain in left leg: Secondary | ICD-10-CM

## 2015-02-28 DIAGNOSIS — M79604 Pain in right leg: Secondary | ICD-10-CM | POA: Diagnosis present

## 2015-03-13 ENCOUNTER — Telehealth: Payer: Self-pay | Admitting: *Deleted

## 2015-03-13 ENCOUNTER — Other Ambulatory Visit: Payer: Self-pay | Admitting: *Deleted

## 2015-03-13 DIAGNOSIS — M79605 Pain in left leg: Principal | ICD-10-CM

## 2015-03-13 DIAGNOSIS — M79604 Pain in right leg: Secondary | ICD-10-CM

## 2015-03-13 NOTE — Telephone Encounter (Signed)
Patient is aware he has appt on 03/15/15 for ABI bi lat  [facility] 03/15/15 10:45 am

## 2015-03-14 ENCOUNTER — Ambulatory Visit (HOSPITAL_COMMUNITY)
Admission: RE | Admit: 2015-03-14 | Discharge: 2015-03-14 | Disposition: A | Discharge status: Home / Self Care | Payer: Medicare Other | Source: Ambulatory Visit | Attending: Neurology | Admitting: Neurology

## 2015-03-14 DIAGNOSIS — M79604 Pain in right leg: Secondary | ICD-10-CM | POA: Diagnosis not present

## 2015-03-14 DIAGNOSIS — M79605 Pain in left leg: Secondary | ICD-10-CM | POA: Diagnosis not present

## 2015-03-14 NOTE — Progress Notes (Signed)
VASCULAR LAB PRELIMINARY  ARTERIAL  ABI completed: Bilateral ABIs are non-compressible with normal triphasic flow. Bilateral TBIs are normal.     RIGHT    LEFT    PRESSURE WAVEFORM  PRESSURE WAVEFORM  BRACHIAL 133 Triphasic BRACHIAL 128 Triphasic  DP [place] Triphasic DP [place] Triphasic  PT [place] Triphasic PT [place] Triphasic  GREAT TOE 0.95 Normal GREAT TOE 0.92 Normal    RIGHT LEFT  ABI Noncompressible Noncompressible     Eason Housman, Gerarda GuntherRita D, RVT 03/14/2015, 11:49 AM

## 2015-03-15 ENCOUNTER — Encounter: Payer: Self-pay | Admitting: Internal Medicine

## 2015-03-15 ENCOUNTER — Ambulatory Visit (HOSPITAL_COMMUNITY): Payer: Medicare Other

## 2015-03-15 ENCOUNTER — Ambulatory Visit (INDEPENDENT_AMBULATORY_CARE_PROVIDER_SITE_OTHER): Payer: Medicare Other | Admitting: Internal Medicine

## 2015-03-15 VITALS — BP 122/72 | HR 69 | Ht 72.0 in | Wt 256.0 lb

## 2015-03-15 DIAGNOSIS — G4733 Obstructive sleep apnea (adult) (pediatric): Secondary | ICD-10-CM | POA: Diagnosis not present

## 2015-03-15 DIAGNOSIS — J449 Chronic obstructive pulmonary disease, unspecified: Secondary | ICD-10-CM | POA: Diagnosis not present

## 2015-03-15 NOTE — Progress Notes (Signed)
Subjective:    Patient ID: Joe Palmer, male    DOB: 01-07-1941, 74 y.o.   MRN: [id number]  HPI 74 yo male former smoker with known hx of COPD  Hx of OSA -not on CPAP (previously on CPAP -lost 25lb stopped wearing it)   12/15/2013 Post Hospital follow up  Patient was admitted March 23 through March 26 for COPD, exacerbation. He was treated with IV steroids, bronchodilators, and antibiotics. Patient did have some right hemodynamic, elevation. A sniff test was negative for diaphragm paralysis. Patient says since discharge. He is feeling improved with decreased cough and congestion. He feels that he is getting year. His baseline Has few days left of prednisone  Patient says that his COPD he has been managed by his primary care physician and was on Advair and prednisone. Prior to admission . He denies any hemoptysis, orthopnea, PND, or increased leg swelling.  01/23/14- 73 yoM former smoker (1ppd 40 pk yrs) re-establishing for COPD Former patient-last seen 2010; saw TP 12-15-13 for HFU; COPD Hx OSA- stopped CPAP after weight loss. He had worn CPAP 12/Advanced based on a sleep study 02/15/02 AHI 62/hr, but stopped after he lost 25 pounds and felt he no longer needed. He says he is comfortable without it and getting no complaints about snoring. His main concern now is dyspnea with known history of COPD. He had to be hospitalized for dyspnea after a [place] the [place] during peak pollen season. Now baseline daily cough with occasional scant white phlegm. Using nebulizer every other day, occasional rescue inhaler, Advair and Spiriva. Flonase for rhinitis. Quit smoking 2002. He denies cardiac history but is diabetic. CXR 12/05/13 IMPRESSION:  1. Elevated right hemidiaphragm. Sniff test showed mobile, weak diaphragm with eventration 2. Thoracic spondylosis.  Electronically Signed  By: Herbie Baltimore M.D.  On: 12/05/2013 19:51  09/11/14- 73 yoM former smoker (1ppd 40 pk yrs) re-establishing for  COPD saw TP 12-15-13 for HFU; COPD FOLLOWS FOR: recently getting over bronchitis otherwise doing well overall;   PFT-03-2014: Moderately severe obstructive airways disease with slight response to bronchodilator, moderately severe restriction and severe diffusion defect. FVC 2.59/61%, FEV1 1.94/63%, FEV1/FVC 0.75, TLC 65%, DLCO 47%. Had treatment for cellulitis of legs and now okay except for some residual edema. Working with therapist. Developed Bell's palsy with left facial droop. Chest x-ray-elevated right hemidiaphragm due to weakness/ eventration w/o paralysis  03/15/15-  73 yoM former smoker (1ppd 40 pk yrs) followed for COPD Complicated by DM Follows For: Pt c/o increase SOB, wheezing due to weather and allergies. Pt denies any cough, chest congestion/tightness.  Occasional productive cough with white sputum. Blames "allergy" but denies nasal discharge. Rare use of rescue inhaler or nebulizer. Continues Advair and Spiriva once daily. Wearing elastic hose for lymphedema.  ROS-see HPI Constitutional:   No-   weight loss, night sweats, fevers, chills, fatigue, lassitude. HEENT:   No-  headaches, difficulty swallowing, tooth/dental problems, sore throat,       No-  sneezing, itching, ear ache, nasal congestion, post nasal drip,  CV:  No-   chest pain, orthopnea, PND, +swelling in lower extremities, anasarca,                                  dizziness, palpitations Resp: +  shortness of breath with exertion or at rest.              + productive cough,  +  non-productive cough,  No- coughing up of blood.              No-   change in color of mucus.  No- wheezing.   Skin: No-   rash or lesions. GI:  No-   heartburn, indigestion, abdominal pain, nausea, vomiting, diarrhea,                 change in bowel habits, loss of appetite GU: No-   dysuria, change in color of urine, no urgency or frequency.  No- flank pain. MS:  No-   joint pain or swelling.  No- decreased range of motion.  No- back  pain. Neuro-   + Bell's palsy with left facial droop Psych:  No- change in mood or affect. No depression or anxiety.  No memory loss.   Objective:  OBJ- Physical Exam General- Alert, Oriented, Affect-appropriate, Distress- none acute, +overweight Skin- rash-none, lesions- none, excoriation- none Lymphadenopathy- none Head- atraumatic            Eyes- Gross vision intact, PERRLA, conjunctivae and secretions clear            Ears- Hearing, canals-normal            Nose- Clear, no-Septal dev, mucus, polyps, erosion, perforation             Throat- Mallampati II , mucosa clear , drainage- none, tonsils- atrophic, own teeth Neck- flexible , trachea midline, no stridor , thyroid nl, carotid no bruit Chest - symmetrical excursion , unlabored           Heart/CV- RRR , no murmur , no gallop  , no rub, nl s1 s2                           - JVD- none , edema- none, stasis changes- none, varices- none           Lung- + wet, raspy mid chest/central airway sound., unlabored, wheeze- none, cough- none , dullness-none, rub- none           Chest wall-  Abd-  Br/ Gen/ Rectal- Not done, not indicated Extrem- +scars bilateral TKR, +superficial varices Neuro- + Bell's palsy with left facial droop-improved    Assessment & Plan:

## 2015-03-15 NOTE — Patient Instructions (Signed)
We can continue present meds  If we can help you- please call

## 2015-03-19 NOTE — Assessment & Plan Note (Signed)
Long-term remission. He is not aware of snoring or excessive sleepiness.

## 2015-03-19 NOTE — Assessment & Plan Note (Signed)
Currently well controlled, with exacerbations associated with weather change mostly. He feels current management is good. Plan-continue current meds.

## 2015-03-20 ENCOUNTER — Other Ambulatory Visit: Payer: Self-pay | Admitting: *Deleted

## 2015-03-20 ENCOUNTER — Telehealth: Payer: Self-pay | Admitting: *Deleted

## 2015-03-20 MED ORDER — PRAMIPEXOLE DIHYDROCHLORIDE 0.125 MG PO TABS: ORAL_TABLET | ORAL | Status: DC

## 2015-03-20 NOTE — Telephone Encounter (Signed)
Patient is aware of ABI results  Medication [facility] .

## 2015-03-20 NOTE — Telephone Encounter (Signed)
-----   Message from Drema DallasAdam R Jaffe, DO sent at 03/20/2015 11:31 AM EDT ----- ABI does not show any significant peripheral vascular disease, although there is some hardening of the blood vessels.  I would suggest treating the restless leg symptoms with medication.  We can try pramipexole 0.125mg  in the evening (2 hours before going to bed) for 7 days.  In 7 days, if still not effective, increase dose to 0.25mg  in the evening.

## 2015-06-07 ENCOUNTER — Ambulatory Visit (INDEPENDENT_AMBULATORY_CARE_PROVIDER_SITE_OTHER): Payer: Medicare Other | Admitting: Neurology

## 2015-06-07 ENCOUNTER — Encounter: Payer: Self-pay | Admitting: Neurology

## 2015-06-07 VITALS — BP 128/80 | HR 74 | Ht 72.0 in | Wt 252.0 lb

## 2015-06-07 DIAGNOSIS — G510 Bell's palsy: Secondary | ICD-10-CM

## 2015-06-07 DIAGNOSIS — E118 Type 2 diabetes mellitus with unspecified complications: Secondary | ICD-10-CM | POA: Diagnosis not present

## 2015-06-07 DIAGNOSIS — G2581 Restless legs syndrome: Secondary | ICD-10-CM | POA: Diagnosis not present

## 2015-06-07 LAB — CBC WITH DIFFERENTIAL/PLATELET
BASOS PCT: 1 % (ref 0–1)
Basophils Absolute: 0.1 10*3/uL (ref 0.0–0.1)
EOS ABS: 0.4 10*3/uL (ref 0.0–0.7)
EOS PCT: 5 % (ref 0–5)
HCT: 44 % (ref 39.0–52.0)
Hemoglobin: 14.8 g/dL (ref 13.0–17.0)
Lymphocytes Relative: 26 % (ref 12–46)
Lymphs Abs: 2.1 10*3/uL (ref 0.7–4.0)
MCH: 32.7 pg (ref 26.0–34.0)
MCHC: 33.6 g/dL (ref 30.0–36.0)
MCV: 97.3 fL (ref 78.0–100.0)
MONOS PCT: 9 % (ref 3–12)
MPV: 10.6 fL (ref 8.6–12.4)
Monocytes Absolute: 0.7 10*3/uL (ref 0.1–1.0)
NEUTROS PCT: 59 % (ref 43–77)
Neutro Abs: 4.8 10*3/uL (ref 1.7–7.7)
PLATELETS: 200 10*3/uL (ref 150–400)
RBC: 4.52 MIL/uL (ref 4.22–5.81)
RDW: 13.9 % (ref 11.5–15.5)
WBC: 8.2 10*3/uL (ref 4.0–10.5)

## 2015-06-07 LAB — HEMOGLOBIN A1C
HEMOGLOBIN A1C: 7.2 % — AB (ref <5.7)
MEAN PLASMA GLUCOSE: 160 mg/dL — AB (ref <117)

## 2015-06-07 NOTE — Patient Instructions (Signed)
Continue pramipexole 0.125mg  in the evening for restless leg Since you noticed slight worsening of the bell's palsy, we will check some blood work, including CBC with diff, ANA, Sed Rate, Lyme, B12, ACE and Hgb A1c Monitor symptoms for now.  If you notice worsening facial weakness, numbness, double vision, hearing changes, dizziness or headache, please call Otherwise, follow up in 3 months.

## 2015-06-07 NOTE — Progress Notes (Signed)
NEUROLOGY FOLLOW UP OFFICE NOTE  Joe Palmer [id number]  HISTORY OF PRESENT ILLNESS: Joe Palmer is a 74 year old right-handed man with history of COPD, OSA, asthma, type II diabetes and history of left Bell's palsy who follows up for bilateral leg pain and restless leg syndrome.  ABI and labs reviewed.    UPDATE: ABI was negative for peripheral vascular disease.  Ferritin level was 32.9.  He was started on pramipexole 0.$RemoveBeforeDE'125mg'[id number]$  in the evening.  The restless leg symptoms are well controlled.  However, about a month ago, he noticed slight worsening of the Bell's palsy.  He felt slight numbness around the left side of his mouth with slight drooping.  Also, his left eye started to water more.  It hasn't changed over the past month.  He denies double vision, dizziness, asymmetric hearing abnormalities, or headache.   HISTORY: Restless Leg Syndrome: For over a year, he has been experiencing pain in either leg.  It is a diffuse aching pain of the entire legs.  It is not a shooting radicular pain.  He does have mild mid lower back pain.  He denies numbness or tingling.  He does feel it during the day but it is most pronounced at night.  He will wake up feeling either leg with this discomfort.  He has an urge to move his legs and will get up to walk, which relieves it but does not resolve it.  His wife also notes that his leg will jerk in his sleep.  About a month ago, his legs just gave out.  He saw his orthopedist who ordered imaging of the legs, which were unremarkable.  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  Labs show that his serum glucose levels have been in the 200s-300s.  Last A1c available in EPIC from a year ago was 8.1.  PAST MEDICAL HISTORY: Past Medical History  Diagnosis Date  . Diabetes mellitus without complication   . Asthma   .  Sleep apnea   . COPD (chronic obstructive pulmonary disease)     MEDICATIONS: Current Outpatient Prescriptions on File Prior to Visit  Medication Sig Dispense Refill  . albuterol (PROVENTIL HFA;VENTOLIN HFA) 108 (90 BASE) MCG/ACT inhaler Inhale 2 puffs into the lungs every 6 (six) hours as needed for wheezing or shortness of breath.    Marland Kitchen albuterol (PROVENTIL) (2.5 MG/3ML) 0.083% nebulizer solution Inhale 3 mLs into the lungs every 6 (six) hours as needed for wheezing or shortness of breath.     . Blood Glucose Monitoring Suppl (ONE TOUCH ULTRA 2) W/DEVICE KIT     . cetirizine (ZYRTEC) 10 MG tablet Take 10 mg by mouth daily as needed for allergies.    . fluticasone (FLONASE) 50 MCG/ACT nasal spray Place 1 spray into both nostrils daily.    . Fluticasone-Salmeterol (ADVAIR DISKUS) 250-50 MCG/DOSE AEPB 1 puff then rinse mouth twice daily 60 each prn  . FREESTYLE LITE test strip     . ibuprofen (ADVIL,MOTRIN) 200 MG tablet Take 800 mg by mouth every 6 (six) hours as needed for moderate pain.    Marland Kitchen LANTUS SOLOSTAR 100 UNIT/ML Solostar Pen Inject 35 Units into the skin at bedtime.     . metFORMIN (GLUCOPHAGE) 850 MG tablet Takes 1 tab in am, then 2 tabs throughout the rest of the  day    . ONETOUCH DELICA LANCETS FINE MISC     . oxyCODONE-acetaminophen (PERCOCET) 10-325 MG per tablet Take 1 tablet by mouth every 6 (six) hours as needed for pain.     Marland Kitchen oxyCODONE-acetaminophen (PERCOCET/ROXICET) 5-325 MG per tablet Take 1 tablet by mouth every 6 (six) hours as needed.     . pramipexole (MIRAPEX) 0.125 MG tablet pramipexole 0.125mg  in the evening (2 hours before going to bed) for 7 days. In 7 days, if still not effective, increase dose to 0.25mg  in the evening. 60 tablet 2  . SPIRIVA HANDIHALER 18 MCG inhalation capsule Place 1 capsule into inhaler and inhale daily.     No current facility-administered medications on file prior to visit.    ALLERGIES: Allergies  Allergen Reactions  . Sulfa  Antibiotics Other (See Comments)    unknown    FAMILY HISTORY: Family History  Problem Relation Age of Onset  . Allergies Mother   . Diabetes Mother     SOCIAL HISTORY: Social History   Social History  . Marital Status: Married    Spouse Name: N/A  . Number of Children: 3  . Years of Education: N/A   Occupational History  . Not on file.   Social History Main Topics  . Smoking status: Former Smoker -- 1.00 packs/day for 40 years    Types: Cigarettes    Quit date: 09/15/2000  . Smokeless tobacco: Never Used  . Alcohol Use: 0.0 oz/week    0 Standard drinks or equivalent per week     Comment: socially  . Drug Use: No  . Sexual Activity:    Partners: Female   Other Topics Concern  . Not on file   Social History Narrative   Lives with wife in a one story home.  Retired from [employer].  Has 3 children.      REVIEW OF SYSTEMS: Constitutional: No fevers, chills, or sweats, no generalized fatigue, change in appetite Eyes: No visual changes, double vision, eye pain Ear, nose and throat: No hearing loss, ear pain, nasal congestion, sore throat Cardiovascular: No chest pain, palpitations Respiratory:  Wheezing due to allergies GastrointestinaI: No nausea, vomiting, diarrhea, abdominal pain, fecal incontinence Genitourinary:  No dysuria, urinary retention or frequency Musculoskeletal:  No neck pain, back pain Integumentary: No rash, pruritus, skin lesions Neurological: as above Psychiatric: No depression, insomnia, anxiety Endocrine: No palpitations, fatigue, diaphoresis, mood swings, change in appetite, change in weight, increased thirst Hematologic/Lymphatic:  No anemia, purpura, petechiae. Allergic/Immunologic: no itchy/runny eyes, nasal congestion, recent allergic reactions, rashes  PHYSICAL EXAM: Filed Vitals:   06/07/15 0758  BP: 128/80  Pulse: 74   General: No acute distress.  Patient appears well-groomed.   Head:  Normocephalic/atraumatic Eyes:   Fundoscopic exam unremarkable without vessel changes, exudates, hemorrhages or papilledema. Neck: supple, no paraspinal tenderness, full range of motion Heart:  Regular rate and rhythm Lungs:  Clear to auscultation bilaterally Back: No paraspinal tenderness Neurological Exam: alert and oriented to person, place, and time. Attention span and concentration intact, recent and remote memory intact, fund of knowledge intact.  Speech fluent and not dysarthric, language intact.  Mild left sided LMN weakness (it appears unchanged compared to prior visit).  Otherwise, CN II-XII intact. Fundoscopic exam unremarkable without vessel changes, exudates, hemorrhages or papilledema.  Bulk and tone normal, left foot drop, otherwise muscle strength 5/5 throughout.  Decreased pinprick and vibration over dorsum of left foot.  Deep tendon reflexes 1+ throughout.  Finger to nose testing  intact.  Steppage with left foot. Marland Kitchen  IMPRESSION: Left-sided Bell's palsy.  He maintains it got slightly worse a month ago but has stabilized.  I don't appreciate any worsening of symptoms on exam compared to last visit, myself.  But he endorses slight worsening of symptoms.  Probably still Bell's palsy.  Diabetes may be a cause as well. Restless leg syndrome, improved Type 2 diabetes mellitus.  PLAN: 1.  To rule out other causes of facial weakness, we will check CBC with diff, ANA, Sed Rate, Lyme, B12, ACE and Hgb A1c 2.  Continue pramipexole 0.125mg  at night 3.  We also discussed MRI of brain.  He would like to hold off for now.  I am okay with that, since I don't see any new changes on my exam.  However, if he should develop worsening facial droop, numbness, or new symptoms such as double vision, hearing changes, dizziness or headache, he is to call immediately and I would proceed with MRI of brain and facial nerve with and without contrast. 4.  Otherwise, follow up in 3 months.  15 minutes spent face to face with patient, over 50%  spent discussing diagnosis and management.  Metta Clines, DO  CC:  Kelton Pillar, MD

## 2015-06-08 LAB — ANTI-NUCLEAR AB-TITER (ANA TITER): ANA Titer 1: 1:40 {titer} — ABNORMAL HIGH

## 2015-06-08 LAB — SEDIMENTATION RATE: Sed Rate: 6 mm/hr (ref 0–20)

## 2015-06-08 LAB — LYME AB/WESTERN BLOT REFLEX: B BURGDORFERI AB IGG+ IGM: 0.49 {ISR}

## 2015-06-08 LAB — ANA: ANA: POSITIVE — AB

## 2015-06-08 LAB — VITAMIN B12: Vitamin B-12: 391 pg/mL (ref 211–911)

## 2015-06-08 LAB — ANGIOTENSIN CONVERTING ENZYME: ANGIOTENSIN-CONVERTING ENZYME: 90 U/L — AB (ref 8–52)

## 2015-06-12 ENCOUNTER — Telehealth: Payer: Self-pay | Admitting: Neurology

## 2015-06-12 DIAGNOSIS — R29810 Facial weakness: Secondary | ICD-10-CM

## 2015-06-12 NOTE — Telephone Encounter (Signed)
Dr Everlena Cooper, Patient wants to go ahead and schedule the MRI of the brain as dictated in your last office note. Do you want this without  Or  with contrast

## 2015-06-12 NOTE — Telephone Encounter (Signed)
MRI of brain and facial nerve, without and with contrast for facial weakness

## 2015-06-12 NOTE — Telephone Encounter (Signed)
Pt states that he would like to go ahead and sch the MRI please call patient back at [phone]

## 2015-06-12 NOTE — Telephone Encounter (Signed)
.  Is this an MRI of the brain with attention to Facial nerves??  Is that how I would order this? Sorry Thanks?

## 2015-06-13 NOTE — Telephone Encounter (Signed)
MRI of the brain with attention to facial nerves scheduled on Tuesday 06/19/2015 at 5 pm arrive at 4:45pm at [facility]

## 2015-06-13 NOTE — Telephone Encounter (Signed)
Patient aware of date and time, No Prep

## 2015-06-13 NOTE — Telephone Encounter (Signed)
Yes, that is how I would order it "MRI of brain without and with contrast with attention to facial nerves"

## 2015-06-19 ENCOUNTER — Ambulatory Visit (HOSPITAL_COMMUNITY): Admission: RE | Admit: 2015-06-19 | Payer: Medicare Other | Source: Ambulatory Visit

## 2015-07-02 ENCOUNTER — Ambulatory Visit (HOSPITAL_COMMUNITY)
Admission: RE | Admit: 2015-07-02 | Discharge: 2015-07-02 | Disposition: A | Discharge status: Home / Self Care | Payer: Medicare Other | Source: Ambulatory Visit | Attending: Neurology | Admitting: Neurology

## 2015-07-02 DIAGNOSIS — G319 Degenerative disease of nervous system, unspecified: Secondary | ICD-10-CM | POA: Diagnosis not present

## 2015-07-02 DIAGNOSIS — I739 Peripheral vascular disease, unspecified: Secondary | ICD-10-CM | POA: Insufficient documentation

## 2015-07-02 DIAGNOSIS — R29810 Facial weakness: Secondary | ICD-10-CM | POA: Diagnosis not present

## 2015-07-02 DIAGNOSIS — E119 Type 2 diabetes mellitus without complications: Secondary | ICD-10-CM | POA: Insufficient documentation

## 2015-07-02 LAB — CREATININE, SERUM
Creatinine, Ser: 0.93 mg/dL (ref 0.61–1.24)
GFR calc Af Amer: >60 mL/min (ref >60)

## 2015-07-02 MED ORDER — GADOBENATE DIMEGLUMINE 529 MG/ML IV SOLN
20.0000 mL | Freq: Once | INTRAVENOUS | Status: AC
  Administered 2015-07-02: 20 mL via INTRAVENOUS

## 2015-07-06 ENCOUNTER — Other Ambulatory Visit: Payer: Self-pay | Admitting: *Deleted

## 2015-07-06 DIAGNOSIS — G510 Bell's palsy: Secondary | ICD-10-CM

## 2015-07-06 DIAGNOSIS — M79605 Pain in left leg: Secondary | ICD-10-CM

## 2015-07-06 DIAGNOSIS — G4761 Periodic limb movement disorder: Secondary | ICD-10-CM

## 2015-07-06 DIAGNOSIS — G2581 Restless legs syndrome: Secondary | ICD-10-CM

## 2015-07-06 DIAGNOSIS — R29810 Facial weakness: Secondary | ICD-10-CM

## 2015-07-06 DIAGNOSIS — M79604 Pain in right leg: Secondary | ICD-10-CM

## 2015-07-12 ENCOUNTER — Other Ambulatory Visit: Payer: Self-pay | Admitting: Neurology

## 2015-07-12 ENCOUNTER — Ambulatory Visit
Admission: RE | Admit: 2015-07-12 | Discharge: 2015-07-12 | Disposition: A | Discharge status: Home / Self Care | Payer: Medicare Other | Source: Ambulatory Visit | Attending: Neurology | Admitting: Neurology

## 2015-07-12 DIAGNOSIS — G2581 Restless legs syndrome: Secondary | ICD-10-CM

## 2015-07-12 DIAGNOSIS — M79605 Pain in left leg: Secondary | ICD-10-CM

## 2015-07-12 DIAGNOSIS — M79604 Pain in right leg: Secondary | ICD-10-CM

## 2015-07-12 DIAGNOSIS — R29810 Facial weakness: Secondary | ICD-10-CM

## 2015-07-12 DIAGNOSIS — G4761 Periodic limb movement disorder: Secondary | ICD-10-CM

## 2015-07-12 DIAGNOSIS — G510 Bell's palsy: Secondary | ICD-10-CM

## 2015-07-12 LAB — CSF CELL COUNT WITH DIFFERENTIAL
RBC Count, CSF: 1 cu mm — ABNORMAL HIGH
Tube #: 3
WBC CSF: 1 uL (ref 0–5)

## 2015-07-12 LAB — PROTEIN, CSF: TOTAL PROTEIN, CSF: 68 mg/dL — AB (ref 15–45)

## 2015-07-12 LAB — GLUCOSE, CSF: Glucose, CSF: 92 mg/dL — ABNORMAL HIGH (ref 43–76)

## 2015-07-12 NOTE — Discharge Instructions (Signed)

## 2015-07-13 ENCOUNTER — Other Ambulatory Visit: Payer: Self-pay | Admitting: Neurology

## 2015-07-13 NOTE — Telephone Encounter (Signed)
Rx sent 

## 2015-07-15 LAB — CSF CULTURE
GRAM STAIN: NONE SEEN
GRAM STAIN: NONE SEEN

## 2015-07-15 LAB — CSF CULTURE W GRAM STAIN: Organism ID, Bacteria: NO GROWTH

## 2015-07-15 LAB — IGG,CSF: IgG, CSF: 6.4 mg/dL (ref 0.8–7.7)

## 2015-07-16 ENCOUNTER — Telehealth: Payer: Self-pay

## 2015-07-16 NOTE — Telephone Encounter (Signed)
  [facility] called with Gram Stain report. Reported No WBC seen, no organisms seen, no growth @ 3 days.

## 2015-07-17 LAB — ANGIOTENSIN CONVERTING ENZYME, CSF: ACE, CSF: 8 U/L (ref <=15)

## 2015-07-18 ENCOUNTER — Telehealth: Payer: Self-pay | Admitting: Neurology

## 2015-07-18 DIAGNOSIS — G379 Demyelinating disease of central nervous system, unspecified: Secondary | ICD-10-CM

## 2015-07-18 LAB — OLIGOCLONAL BANDS, CSF + SERM

## 2015-07-18 NOTE — Addendum Note (Signed)
Addended by: Sheilah MinsFOX, JADA A on: 07/18/2015 09:53 AM   Modules accepted: Orders

## 2015-07-18 NOTE — Telephone Encounter (Signed)
Order's placed. NCV-EMG order given to front desk staff for scheduling. Spoke with patient, who will get labs done same day as NCV. Patient aware he will need to see front desk staff before leaving to get blood drawn.

## 2015-07-18 NOTE — Telephone Encounter (Signed)
I called ans spoke with Mr. Vanice SarahBergen regarding CSF results.  Lyme is still pending.  ACE is normal.  It does show mildly elevated protein of 68 and more than 5 oligoclonal bands, which are nonspecific findings.  I would like to set him up for NCV-EMG for demyelinating protocol.  I would also like to check labs, specifically EBV and HIV.  I told him that we will get back to him to set this up.

## 2015-07-23 LAB — B. BURGDORFI ANTIBODIES, CSF: Lyme Ab: NEGATIVE

## 2015-08-02 ENCOUNTER — Ambulatory Visit (INDEPENDENT_AMBULATORY_CARE_PROVIDER_SITE_OTHER): Payer: Medicare Other | Admitting: Neurology

## 2015-08-02 DIAGNOSIS — G379 Demyelinating disease of central nervous system, unspecified: Secondary | ICD-10-CM

## 2015-08-02 DIAGNOSIS — G5601 Carpal tunnel syndrome, right upper limb: Secondary | ICD-10-CM

## 2015-08-02 DIAGNOSIS — G609 Hereditary and idiopathic neuropathy, unspecified: Secondary | ICD-10-CM

## 2015-08-02 NOTE — Procedures (Signed)
[facility]  [address], [address]  [place], [place] [address] Tel: [phone] Fax:  [phone] Test Date:  08/02/2015  Patient: Joe FullerRussell Palmer DOB: 25-Jan-1941 Physician: Nita Sickleonika Patel  Sex: Male Height: 6' " Ref Phys: Shon MilletAdam Jaffe, M.D.  ID#: [id number]   Technician: Judie PetitM. Dean   Patient Complaints: This is a 74 year old gentleman referred for evaluation of gait unsteadiness and bilateral feet paresthesias.  NCV & EMG Findings: Extensive electrodiagnostic testing of the right upper and lower extremity shows: 1. Right median sensory response shows prolonged latency with preserved amplitude. Right ulnar and radial sensory responses are within normal limits. 2. Right sural and superficial peroneal sensory responses are absent. 3. Right median motor response shows prolonged agency with preserved amplitude. Right ulnar motor responses within normal limits. 4. Right peroneal motor response shows reduced amplitude when recording at the extensor digitorum brevis; however, peroneal motor response at the tibialis anterior is within normal limits. The right tibial motor response is also within normal limits. 5. F wave studies of the median, ulnar, and tibial nerves is within normal limits. Tibial H reflex study is mildly prolonged. 6. Chronic motor axon loss changes are seen affecting the muscles below the knee, without accompanied active denervation.  Impression: 1. The electrophysiologic findings are most consistent with a chronic sensorimotor polyneuropathy, axon loss in type, affecting the lower extremities.  2. Right median neuropathy at or distal to the wrist, consistent with the clinical diagnosis of carpal tunnel syndrome. Overall, these findings are mild-to-moderate in degree electrically. 3. There is no definite evidence of a polyradiculoneuropathy affecting the right side.   _____________________________ Nita Sickleonika Patel, D.O.    Nerve Conduction Studies Anti Sensory  Summary Table   Stim Site NR Peak (ms) Norm Peak (ms) P-T Amp (V) Norm P-T Amp  Right Median Anti Sensory (2nd Digit)  Wrist    4.9 <3.8 12.4 >10  Left Radial Anti Sensory (Base 1st Digit)  Wrist    2.3 <2.8 15.5 >10  Right Sup Peroneal Anti Sensory (Ant Lat Mall)  12 cm NR  <4.6  >3  Right Sural Anti Sensory (Lat Mall)  Calf NR  <4.6  >3  Right Ulnar Anti Sensory (5th Digit)  Wrist    3.2 <3.2 7.8 >5   Motor Summary Table   Stim Site NR Onset (ms) Norm Onset (ms) O-P Amp (mV) Norm O-P Amp Site1 Site2 Delta-0 (ms) Dist (cm) Vel (m/s) Norm Vel (m/s)  Right Median Motor (Abd Poll Brev)  Wrist    5.2 <4.0 7.6 >5 Elbow Wrist 5.1 26.0 51 >50  Elbow    10.3  6.9         Right Peroneal Motor (Ext Dig Brev)  Ankle    3.0 <6.0 2.4 >2.5 B Fib Ankle 8.8 35.0 40 >40  B Fib    11.8  1.8  Poplt B Fib 2.4 10.0 42 >40  Poplt    14.2  1.7         Right Peroneal TA Motor (Tib Ant)  Fib Head    3.1 <4.5 3.5 >3 Poplit Fib Head 2.0 10.0 50 >40  Poplit    5.1  3.4         Right Tibial Motor (Abd Hall Brev)  Ankle    4.1 <6.0 4.4 >4 Knee Ankle 11.0 44.0 40 >40  Knee    15.1  1.6         Right Ulnar Motor (Abd Dig Minimi)  Wrist  2.9 <3.1 9.1 >7 B Elbow Wrist 4.4 23.0 52 >50  B Elbow    7.3  8.2  A Elbow B Elbow 2.4 12.0 50 >50  A Elbow    9.7  7.7          F Wave Studies   NR F-Lat (ms) Lat Norm (ms) L-R F-Lat (ms)  Right Median (Mrkrs) (Abd Poll Brev)     32.95 <33   Right Tibial (Mrkrs) (Abd Hallucis)     44.71 <55   Right Ulnar (Mrkrs) (Abd Dig Min)     32.81 <33    H Reflex Studies   NR H-Lat (ms) Lat Norm (ms) L-R H-Lat (ms)  Right Tibial (Gastroc)     37.28 <35    EMG   Side Muscle Ins Act Fibs Psw Fasc Number Recrt Dur Dur. Amp Amp. Poly Poly. Comment  Right AntTibialis Nml Nml Nml Nml 1- Rapid Some 1+ Some 1+ Nml Nml N/A  Right 1stDorInt Nml Nml Nml Nml Nml Nml Nml Nml Nml Nml Nml Nml N/A  Right Abd Poll Brev Nml Nml Nml Nml Nml Nml Nml Nml Nml Nml Nml Nml N/A  Right Ext  Indicis Nml Nml Nml Nml Nml Nml Nml Nml Nml Nml Nml Nml N/A  Right PronatorTeres Nml Nml Nml Nml Nml Nml Nml Nml Nml Nml Nml Nml N/A  Right Biceps Nml Nml Nml Nml Nml Nml Nml Nml Nml Nml Nml Nml N/A  Right Triceps Nml Nml Nml Nml Nml Nml Nml Nml Nml Nml Nml Nml N/A  Right Deltoid Nml Nml Nml Nml Nml Nml Nml Nml Nml Nml Nml Nml N/A  Right Gastroc Nml Nml Nml Nml 2- Mod-R Some 1+ Some 1+ Nml Nml N/A  Right Flex Dig Long Nml Nml Nml Nml 2- Rapid Some 1+ Some 1+ Nml Nml N/A  Right RectFemoris Nml Nml Nml Nml Nml Nml Nml Nml Nml Nml Nml Nml N/A  Right BicepsFemS Nml Nml Nml Nml Nml Nml Nml Nml Nml Nml Nml Nml N/A  Right GluteusMed Nml Nml Nml Nml Nml Nml Nml Nml Nml Nml Nml Nml N/A  Right Lumbar PSP Low Nml Nml Nml Nml Nml Nml Nml Nml Nml Nml Nml Nml N/A     Waveforms:

## 2015-08-03 ENCOUNTER — Telehealth: Payer: Self-pay

## 2015-08-03 NOTE — Telephone Encounter (Signed)
-----   Message from Drema DallasAdam R Jaffe, DO sent at 08/03/2015 12:25 PM EST ----- EMG showed evidence of neuropathy related to his diabetes, but offered nothing new in regards to the Bell's palsy.  At this point, I would just monitor as long as he is doing well.  If things get worse, he should let us know.

## 2015-08-03 NOTE — Telephone Encounter (Signed)
Message relayed to patient. Verbalized understanding and denied questions.   

## 2015-09-26 ENCOUNTER — Ambulatory Visit (INDEPENDENT_AMBULATORY_CARE_PROVIDER_SITE_OTHER): Payer: Medicare Other | Admitting: Neurology

## 2015-09-26 ENCOUNTER — Encounter: Payer: Self-pay | Admitting: Neurology

## 2015-09-26 VITALS — BP 134/76 | HR 68 | Ht 72.0 in | Wt 256.0 lb

## 2015-09-26 DIAGNOSIS — G510 Bell's palsy: Secondary | ICD-10-CM | POA: Diagnosis not present

## 2015-09-26 DIAGNOSIS — G2581 Restless legs syndrome: Secondary | ICD-10-CM | POA: Diagnosis not present

## 2015-09-26 NOTE — Progress Notes (Signed)
Chart forwarded.  

## 2015-09-26 NOTE — Progress Notes (Signed)
NEUROLOGY FOLLOW UP OFFICE NOTE  CARSON BOGDEN [id number]  HISTORY OF PRESENT ILLNESS: Noland Pizano is a 75 year old right-handed man with history of COPD, OSA, asthma, type II diabetes and history of left Bell's palsy who follows up for left-sided Bell's palsy and restless leg syndrome. Labs, NCV report and MRI brain imaging reviewed.   UPDATE: Restless Leg Syndrome:   He was started on pramipexole 0.119m in the evening.  The restless leg symptoms had been well-controlled but he started taking two tablets (0.218m at bedtime a couple of weeks ago because his legs felt more achy and he was waking up at night.  They are better controlled now.  Left-sided Bell's palsy:  He continued to have residual facial weakness but did gradual improve a little.  However, in August 2016, he began noticing subtle worsening of symptoms, such as increased watering of the left eye and slight numbness and droop at the left side of his mouth.  Labs in September showed Hgb A1c of 7.2, ANA 1:40 with speckled pattern, Sed Rate 6, B12 391, ACE 90, and Lyme negative MRI of the brain with and without contrast on 07/02/15 showed symmetric enhancement of both seventh cranial nerves.  He underwent a lumbar puncture on 07/12/15, which showed CSF cell count of 1, glucose 92, protein 68, Lyme antibodies negative, greater than 5 oligoclonal bands, IgG 6.4, ACE 8, and negative gram stain and culture.  Facial weakness is stable.  NCV-EMG from November revealed a primarily axonal generalized sensorimotor polyneuropathy.  HISTORY: Restless Leg Syndrome: For over a year, he has been experiencing pain in either leg.  It is a diffuse aching pain of the entire legs.  It is not a shooting radicular pain.  He does have mild mid lower back pain.  He denies numbness or tingling.  He does feel it during the day but it is most pronounced at night.  He will wake up feeling either leg with this discomfort.  He has an urge to move his legs  and will get up to walk, which relieves it but does not resolve it.  His wife also notes that his leg will jerk in his sleep.  ABI was negative for peripheral vascular disease.  Ferritin level was 32.9.  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  Left-sided Bell's palsy: In August 2015, he woke up with left upper facial weakness.  Over the next 2 days, it progressed to severe left sided upper and lower facial weakness.  He also noted pain in the left ear, as well as taste abnormality.  Food and drink would spill out of his mouth.  There was no associated unilateral numbness or weakness of the body or headache.  No hyperacusis.  He denied any prior viral illness.  He went to the walk in clinic and was prescribed Valtrex.  A week later, he followed up with his PCP, who prescribed him prednisone taper.    PAST MEDICAL HISTORY: Past Medical History  Diagnosis Date  . Diabetes mellitus without complication ([facility]  . Asthma   . Sleep apnea   . COPD (chronic obstructive pulmonary disease) (HCC)     MEDICATIONS: Current Outpatient Prescriptions on File Prior to Visit  Medication Sig Dispense Refill  . albuterol (PROVENTIL HFA;VENTOLIN HFA) 108 (90 BASE) MCG/ACT inhaler Inhale  2 puffs into the lungs every 6 (six) hours as needed for wheezing or shortness of breath.    Marland Kitchen albuterol (PROVENTIL) (2.5 MG/3ML) 0.083% nebulizer solution Inhale 3 mLs into the lungs every 6 (six) hours as needed for wheezing or shortness of breath.     . Blood Glucose Monitoring Suppl (ONE TOUCH ULTRA 2) W/DEVICE KIT     . cetirizine (ZYRTEC) 10 MG tablet Take 10 mg by mouth daily as needed for allergies.    . fluticasone (FLONASE) 50 MCG/ACT nasal spray Place 1 spray into both nostrils daily.    . Fluticasone-Salmeterol (ADVAIR DISKUS) 250-50 MCG/DOSE AEPB 1 puff then rinse mouth  twice daily 60 each prn  . FREESTYLE LITE test strip     . ibuprofen (ADVIL,MOTRIN) 200 MG tablet Take 800 mg by mouth every 6 (six) hours as needed for moderate pain.    Marland Kitchen LANTUS SOLOSTAR 100 UNIT/ML Solostar Pen Inject 35 Units into the skin at bedtime.     . metFORMIN (GLUCOPHAGE) 850 MG tablet Takes 1 tab in am, then 2 tabs throughout the rest of the day    . ONETOUCH DELICA LANCETS FINE MISC     . oxyCODONE-acetaminophen (PERCOCET/ROXICET) 5-325 MG per tablet Take 1 tablet by mouth every 6 (six) hours as needed.     . pramipexole (MIRAPEX) 0.125 MG tablet TAKE 1 TAB 2 HRS BEFORE BEDTIME FOR 1 WEEK, MAY INCRASE TO 2 TABS NIGHTLY IF NEEDED. 60 tablet 3  . SPIRIVA HANDIHALER 18 MCG inhalation capsule Place 1 capsule into inhaler and inhale daily.     No current facility-administered medications on file prior to visit.    ALLERGIES: Allergies  Allergen Reactions  . Sulfa Antibiotics Other (See Comments)    unknown    FAMILY HISTORY: Family History  Problem Relation Age of Onset  . Allergies Mother   . Diabetes Mother     SOCIAL HISTORY: Social History   Social History  . Marital Status: Married    Spouse Name: N/A  . Number of Children: 3  . Years of Education: N/A   Occupational History  . Not on file.   Social History Main Topics  . Smoking status: Former Smoker -- 1.00 packs/day for 40 years    Types: Cigarettes    Quit date: 09/15/2000  . Smokeless tobacco: Never Used  . Alcohol Use: 0.0 oz/week    0 Standard drinks or equivalent per week     Comment: socially  . Drug Use: No  . Sexual Activity:    Partners: Female   Other Topics Concern  . Not on file   Social History Narrative   Lives with wife in a one story home.  Retired from [employer].  Has 3 children.      REVIEW OF SYSTEMS: Constitutional: No fevers, chills, or sweats, no generalized fatigue, change in appetite Eyes: No visual changes, double vision, eye pain Ear, nose and throat: No  hearing loss, ear pain, nasal congestion, sore throat Cardiovascular: No chest pain, palpitations Respiratory:  No shortness of breath at rest or with exertion, wheezes GastrointestinaI: No nausea, vomiting, diarrhea, abdominal pain, fecal incontinence Genitourinary:  No dysuria, urinary retention or frequency Musculoskeletal:  No neck pain, back pain Integumentary: No rash, pruritus, skin lesions Neurological: as above Psychiatric: No depression, insomnia, anxiety Endocrine: No palpitations, fatigue, diaphoresis, mood swings, change in appetite, change in weight, increased thirst Hematologic/Lymphatic:  No anemia, purpura, petechiae. Allergic/Immunologic: no itchy/runny eyes, nasal congestion, recent allergic reactions, rashes  PHYSICAL EXAM: Filed Vitals:   09/26/15 1428  BP: 134/76  Pulse: 68   General: No acute distress.  Patient appears well-groomed.   Head:  Normocephalic/atraumatic Eyes:  Fundoscopic exam unremarkable without vessel changes, exudates, hemorrhages or papilledema. Neck: supple, no paraspinal tenderness, full range of motion Heart:  Regular rate and rhythm Lungs:  Clear to auscultation bilaterally Back: No paraspinal tenderness Neurological Exam: alert and oriented to person, place, and time. Attention span and concentration intact, recent and remote memory intact, fund of knowledge intact.  Speech fluent and not dysarthric, language intact.  Mild left sided LMN weakness.  Otherwise, CN II-XII intact. Fundoscopic exam unremarkable without vessel changes, exudates, hemorrhages or papilledema.  Bulk and tone normal, left foot drop, otherwise muscle strength 5/5 throughout.  Light touch sensation intact.  Deep tendon reflexes 1+ throughout.  Finger to nose testing intact.  Steppage with left foot.   IMPRESSION: 1.  Left-sided Bell's palsy.  After over a year, bilateral cranial nerves enhance.  He does have positive nonspecific inflammatory markers, such as ANA and  oligoclonal bands.  Serum ACE was a little elevated but absent in CSF.  Persistent symptoms still may be due to diabetes. 2.  Restless leg syndrome, improved 3.  Type 2 diabetes mellitus.  PLAN: Continue pramipexole 0.4m in the evening Optimize glycemic control Follow up in 6 months.  AMetta Clines DO  CC:  EKelton Pillar MD

## 2015-09-26 NOTE — Patient Instructions (Signed)
Continue pramipexole 0.125mg , taking 2 tablets at bedtime Follow up in 6 months

## 2015-10-23 ENCOUNTER — Encounter: Payer: Self-pay | Admitting: Neurology

## 2015-11-08 ENCOUNTER — Encounter: Payer: Self-pay | Admitting: Podiatry

## 2015-11-08 ENCOUNTER — Ambulatory Visit (INDEPENDENT_AMBULATORY_CARE_PROVIDER_SITE_OTHER): Payer: Medicare Other | Admitting: Podiatry

## 2015-11-08 VITALS — BP 149/79 | HR 74 | Resp 16

## 2015-11-08 DIAGNOSIS — B351 Tinea unguium: Secondary | ICD-10-CM

## 2015-11-08 DIAGNOSIS — M79676 Pain in unspecified toe(s): Secondary | ICD-10-CM

## 2015-11-08 DIAGNOSIS — Q828 Other specified congenital malformations of skin: Secondary | ICD-10-CM

## 2015-11-08 DIAGNOSIS — E1151 Type 2 diabetes mellitus with diabetic peripheral angiopathy without gangrene: Secondary | ICD-10-CM | POA: Diagnosis not present

## 2015-11-08 NOTE — Progress Notes (Signed)
   Subjective:    Patient ID: Joe Palmer, male    DOB: 12/17/1940, 75 y.o.   MRN: [id number]  HPI Pt presents with painful callus on his right plantar forefoot   Review of Systems  All other systems reviewed and are negative.      Objective:   Physical Exam        Assessment & Plan:

## 2015-11-11 NOTE — Progress Notes (Signed)
Subjective:     Patient ID: Joe Palmer, male   DOB: 1941/06/21, 75 y.o.   MRN: [id number]  HPI patient presents with painful lesions on the plantar aspect of the right foot and also presents with nail disease 1-5 both feet that are thick and he cannot cut himself and they can become painful. Presents on referral   Review of Systems  All other systems reviewed and are negative.      Objective:   Physical Exam  Constitutional: He is oriented to person, place, and time.  Cardiovascular: Intact distal pulses.   Musculoskeletal: Normal range of motion.  Neurological: He is oriented to person, place, and time.  Skin: Skin is warm and dry.  Nursing note and vitals reviewed.  neurovascular status found to be slightly diminished but intact with sharp Dole vibratory mildly diminished. Patient's noted to have thick yellow brittle nailbeds 1-5 both feet that can become painful especially with shoe gear and is noted to have several lesions plantar aspect right that are painful and thick in nature and impossible for patient to cut     Assessment:     Keratotic-type plantar lesions along with nail disease 1-5 both feet with pain    Plan:     H&P and conditions reviewed and debridement of nailbeds accomplished today and lesions with no iatrogenic bleeding noted. Reappoint for routine care

## 2015-12-13 ENCOUNTER — Other Ambulatory Visit: Payer: Self-pay | Admitting: Neurology

## 2016-02-15 ENCOUNTER — Ambulatory Visit (INDEPENDENT_AMBULATORY_CARE_PROVIDER_SITE_OTHER): Payer: Medicare Other | Admitting: Podiatry

## 2016-02-15 ENCOUNTER — Encounter: Payer: Self-pay | Admitting: Podiatry

## 2016-02-15 ENCOUNTER — Telehealth: Payer: Self-pay | Admitting: Internal Medicine

## 2016-02-15 DIAGNOSIS — E1151 Type 2 diabetes mellitus with diabetic peripheral angiopathy without gangrene: Secondary | ICD-10-CM

## 2016-02-15 DIAGNOSIS — Q828 Other specified congenital malformations of skin: Secondary | ICD-10-CM | POA: Diagnosis not present

## 2016-02-15 DIAGNOSIS — M79676 Pain in unspecified toe(s): Secondary | ICD-10-CM

## 2016-02-15 DIAGNOSIS — B351 Tinea unguium: Secondary | ICD-10-CM

## 2016-02-15 NOTE — Telephone Encounter (Signed)
lmtcb x1 for pt. 

## 2016-02-15 NOTE — Progress Notes (Signed)
Subjective:     Patient ID: Joe Palmer, male   DOB: Nov 08, 1940, 75 y.o.   MRN: [id number]  HPI this patient presents the office with chief complaint of long thick painful nails. He also relates that he has painful calluses on the bottom of his right foot. Patient states that this is painful as he walks and wears his shoes. He was last seen 3 months ago by what Dr. Ardelle AntonWagoner who referred him to my schedule. Patient is type II diabetic, presents the office for an evaluation and treatment of his feet   Review of Systems     Objective:   Physical Exam GENERAL APPEARANCE: Alert, conversant. Appropriately groomed. No acute distress.  VASCULAR: Pedal pulses are  palpable at  [facility] and PT bilateral.  Capillary refill time is immediate to all digits,  Normal temperature gradient.  Digital hair growth is present bilateral  NEUROLOGIC: sensation is diminished  to 5.07 monofilament at 5/5 sites bilateral.  Light touch is intact bilateral, Muscle strength normal.  MUSCULOSKELETAL: acceptable muscle strength, tone and stability bilateral.  Intrinsic muscluature intact bilateral.  Rectus appearance of foot and digits noted bilateral. Hammer toes 2-4 right with plantarflexed metatarsals right foot.  DERMATOLOGIC: skin color, texture, and turgor are within normal limits.  No preulcerative lesions or ulcers  are seen, no interdigital maceration noted.  No open lesions present.  Digital nails are asymptomatic. No drainage noted.Callus sub 3 right foot.      Assessment:     Onychomycosis  Porokeratosis sub 3 right foot.     Plan:     Debridement of nails. Debridement of plantar keratosis right foot.  RTC 3 months   Helane GuntherGregory Keondre Markson DPM

## 2016-02-15 NOTE — Telephone Encounter (Signed)
Per CY-patient can follow up with another provider/NP. Thanks.

## 2016-02-18 NOTE — Telephone Encounter (Signed)
lmtcb X1 for pt to schedule appt.  Since pt is a sleep pt I would schedule with TP for follow up, then to follow up with sleep Dr.

## 2016-02-18 NOTE — Telephone Encounter (Signed)
Pt returning call and going to try to resched another provider or NP//sad.Caren GriffinsStanley A Dalton

## 2016-02-20 NOTE — Telephone Encounter (Signed)
lmtcb x2 for pt. 

## 2016-02-22 NOTE — Telephone Encounter (Signed)
Pt has appt with SG on 6/19.  Pt confirmed appt and voiced no further questions or concerns at this time.

## 2016-03-03 ENCOUNTER — Ambulatory Visit (INDEPENDENT_AMBULATORY_CARE_PROVIDER_SITE_OTHER): Payer: Medicare Other | Admitting: Acute Care

## 2016-03-03 ENCOUNTER — Ambulatory Visit (INDEPENDENT_AMBULATORY_CARE_PROVIDER_SITE_OTHER)
Admission: RE | Admit: 2016-03-03 | Discharge: 2016-03-03 | Disposition: A | Discharge status: Home / Self Care | Payer: Medicare Other | Source: Ambulatory Visit | Attending: Acute Care | Admitting: Acute Care

## 2016-03-03 ENCOUNTER — Encounter: Payer: Self-pay | Admitting: Acute Care

## 2016-03-03 VITALS — BP 138/82 | HR 73 | Ht 72.0 in | Wt 252.6 lb

## 2016-03-03 DIAGNOSIS — G4733 Obstructive sleep apnea (adult) (pediatric): Secondary | ICD-10-CM | POA: Diagnosis not present

## 2016-03-03 DIAGNOSIS — J449 Chronic obstructive pulmonary disease, unspecified: Secondary | ICD-10-CM | POA: Diagnosis not present

## 2016-03-03 DIAGNOSIS — J441 Chronic obstructive pulmonary disease with (acute) exacerbation: Secondary | ICD-10-CM

## 2016-03-03 MED ORDER — PREDNISONE 10 MG PO TABS: ORAL_TABLET | ORAL | Status: DC

## 2016-03-03 NOTE — Patient Instructions (Addendum)
It is nice to meet you today. Add Mucinex 600 mg twice daily with a full glass of water. Prednisone taper; 10 mg tablets: 4 tabs x 2 days, 3 tabs x 2 days, 2 tabs x 2 days 1 tab x 2 days then stop. Continue your Spiriva and Advair, Flonase and Zyrtec as you have been doing. Rinse your mouth with water after use of inhaler. Use your albuterol Neb treatments at home for shortness of breath or wheezing as needed up to every 6 hours. CXR today. We will call you with results. Follow up appointment in 4 weeks Please contact office for sooner follow up if symptoms do not improve or worsen or seek emergency care  .

## 2016-03-03 NOTE — Assessment & Plan Note (Signed)
Mild Flare: Wheezing but no fever/ purulent secretions. Plan: Add Mucinex 600 mg twice daily with a full glass of water. Prednisone taper; 10 mg tablets: 4 tabs x 2 days, 3 tabs x 2 days, 2 tabs x 2 days 1 tab x 2 days then stop. Continue your Spiriva and Advair, Flonase and Zyrtec as you have been doing. Rinse your mouth with water after use of inhaler. Use your albuterol Neb treatments at home for shortness of breath or wheezing as needed up to every 6 hours. CXR today. We will call you with results. Follow up appointment in 4 weeks Please contact office for sooner follow up if symptoms do not improve or worsen or seek emergency care  .

## 2016-03-03 NOTE — Progress Notes (Signed)
History of Present Illness Joe Palmer is a 75 y.o. male  75 yo male former smoker with known hx of COPD  Hx of OSA -not on CPAP (previously on CPAP -lost 25lb stopped wearing it)   6/19/2017Acute Office Visit: Pt. Presents to the office today for chest congestion and wheezing for the last month. He states he feels the trigger is allergy season, but has noted that it is worse the last 2 weeks.He also has had an increase in his shortness of breath.He is compliant with his Spiriva, Advair , Flonase  And  Zyrtec. He is using his rescue inhaler twice daily on the days that he needs it. He has not been using his nebulizer treatments.Secretions are clear, no fever, orthopnea, hemoptysis, or cough.No leg or calf pain.He wears compression stockings for lower extremity edema, which is at his baseline.  Tests:   CXR 03/03/2016: Stable cardiomegaly. Stable chronic interstitial changes consistent with chronic interstitial lung disease. No acute cardiopulmonary disease identified  PFT-03-2014: Moderately severe obstructive airways disease with slight response to bronchodilator, moderately severe restriction and severe diffusion defect. FVC 2.59/61%, FEV1 1.94/63%, FEV1/FVC 0.75, TLC 65%, DLCO 47%   Past medical hx Past Medical History  Diagnosis Date  . Diabetes mellitus without complication ([place])   . Asthma   . Sleep apnea   . COPD (chronic obstructive pulmonary disease) (HCC)      Past surgical hx, Family hx, Social hx all reviewed.  Current Outpatient Prescriptions on File Prior to Visit  Medication Sig  . albuterol (PROVENTIL HFA;VENTOLIN HFA) 108 (90 BASE) MCG/ACT inhaler Inhale 2 puffs into the lungs every 6 (six) hours as needed for wheezing or shortness of breath.  Marland Kitchen albuterol (PROVENTIL) (2.5 MG/3ML) 0.083% nebulizer solution Inhale 3 mLs into the lungs every 6 (six) hours as needed for wheezing or shortness of breath.   . Blood Glucose Monitoring Suppl (ONE TOUCH ULTRA 2)  W/DEVICE KIT   . cetirizine (ZYRTEC) 10 MG tablet Take 10 mg by mouth daily as needed for allergies.  . fluticasone (FLONASE) 50 MCG/ACT nasal spray Place 1 spray into both nostrils daily.  Marland Kitchen FREESTYLE LITE test strip   . ibuprofen (ADVIL,MOTRIN) 200 MG tablet Take 800 mg by mouth every 6 (six) hours as needed for moderate pain.  Marland Kitchen LANTUS SOLOSTAR 100 UNIT/ML Solostar Pen Inject 35 Units into the skin at bedtime.   Glory Rosebush DELICA LANCETS FINE MISC   . oxyCODONE-acetaminophen (PERCOCET/ROXICET) 5-325 MG per tablet Take 1 tablet by mouth every 6 (six) hours as needed.   . pramipexole (MIRAPEX) 0.125 MG tablet TAKE 1 TAB 2 HRS BEFORE BEDTIME FOR 1 WEEK, MAY INCRASE TO 2 TABS NIGHTLY IF NEEDED.  Marland Kitchen SPIRIVA HANDIHALER 18 MCG inhalation capsule Place 1 capsule into inhaler and inhale daily.   No current facility-administered medications on file prior to visit.     Allergies  Allergen Reactions  . Sulfa Antibiotics Other (See Comments)    unknown    Review Of Systems:  Constitutional:   No  weight loss, night sweats,  Fevers, chills, fatigue, or  lassitude.  HEENT:   No headaches,  Difficulty swallowing,  Tooth/dental problems, or  Sore throat,                No sneezing, itching, ear ache, nasal congestion, post nasal drip,   CV:  No chest pain,  Orthopnea, PND, +swelling in lower extremities ( at baseline), no  anasarca, dizziness, palpitations, syncope.   GI  No heartburn, indigestion, abdominal pain, nausea, vomiting, diarrhea, change in bowel habits, loss of appetite, bloody stools.   Resp: + shortness of breath with exertion not at rest.  No excess mucus, no productive cough,  No non-productive cough,  No coughing up of blood.  No change in color of mucus.  + wheezing.  No chest wall deformity  Skin: no rash or lesions.  GU: no dysuria, change in color of urine, no urgency or frequency.  No flank pain, no hematuria   MS:  No joint pain or swelling.  No decreased range of motion.   No back pain.  Psych:  No change in mood or affect. No depression or anxiety.  No memory loss.   Vital Signs BP 138/82 mmHg  Pulse 73  Ht 6' (1.829 m)  Wt 252 lb 9.6 oz (114.579 kg)  BMI 34.25 kg/m2  SpO2 92%   Physical Exam:  General- No distress,  A&Ox3, obese elderly male ENT: No sinus tenderness, TM clear, pale nasal mucosa, no oral exudate,+ post nasal drip, no LAN Cardiac: S1, S2, regular rate and rhythm, no murmur Chest: Mild Exp. wheeze/ No rales/ dullness; no accessory muscle use, no nasal flaring, no sternal retractions Abd.: Soft Non-tender Ext: No clubbing cyanosis, edema Neuro:  normal strength Skin: No rashes, warm and dry Psych: normal mood and behavior   Assessment/Plan  COPD mixed type Mild Flare: Wheezing but no fever/ purulent secretions. Plan: Add Mucinex 600 mg twice daily with a full glass of water. Prednisone taper; 10 mg tablets: 4 tabs x 2 days, 3 tabs x 2 days, 2 tabs x 2 days 1 tab x 2 days then stop. Continue your Spiriva and Advair, Flonase and Zyrtec as you have been doing. Rinse your mouth with water after use of inhaler. Use your albuterol Neb treatments at home for shortness of breath or wheezing as needed up to every 6 hours. CXR today. We will call you with results. Follow up appointment in 4 weeks Please contact office for sooner follow up if symptoms do not improve or worsen or seek emergency care  .   Obstructive sleep apnea Long Term Remission. No longer wearing CPAP No snoring or excessive sleepiness that he is aware of.     Magdalen Spatz, NP 03/03/2016  12:23 PM

## 2016-03-03 NOTE — Assessment & Plan Note (Signed)
Long Term Remission. No longer wearing CPAP No snoring or excessive sleepiness that he is aware of.

## 2016-03-05 ENCOUNTER — Telehealth: Payer: Self-pay | Admitting: *Deleted

## 2016-03-05 MED ORDER — DOXYCYCLINE HYCLATE 100 MG PO TABS: 100.0000 mg | ORAL_TABLET | Freq: Two times a day (BID) | ORAL | Status: DC

## 2016-03-05 NOTE — Telephone Encounter (Signed)
Spoke with states that pt has worsened since OV on Monday- SOB has worsened, is now coughing up yellow/green mucus since this morning.  Denies chest pain, fever.  Pt is taking a pred taper as prescribed on Monday at OV with SG, but is now requesting an abx to go along with pred taper. Pt uses [facility].    Sending to DOD as SG is unavailable this afternoon.  RB please advise.  Thanks!   Allergies  Allergen Reactions  . Sulfa Antibiotics Other (See Comments)    unknown

## 2016-03-05 NOTE — Telephone Encounter (Signed)
Pt aware of Rx being [facility]. Nothing further needed.

## 2016-03-05 NOTE — Telephone Encounter (Signed)
Please call doxy 100 bid x 7 days

## 2016-03-14 ENCOUNTER — Ambulatory Visit: Payer: Medicare Other | Admitting: Internal Medicine

## 2016-03-26 ENCOUNTER — Encounter: Payer: Self-pay | Admitting: Neurology

## 2016-03-26 ENCOUNTER — Ambulatory Visit (INDEPENDENT_AMBULATORY_CARE_PROVIDER_SITE_OTHER): Payer: Medicare Other | Admitting: Neurology

## 2016-03-26 VITALS — BP 142/64 | HR 72 | Ht 72.0 in | Wt 251.0 lb

## 2016-03-26 DIAGNOSIS — G2581 Restless legs syndrome: Secondary | ICD-10-CM

## 2016-03-26 DIAGNOSIS — G510 Bell's palsy: Secondary | ICD-10-CM | POA: Diagnosis not present

## 2016-03-26 DIAGNOSIS — R030 Elevated blood-pressure reading, without diagnosis of hypertension: Secondary | ICD-10-CM | POA: Diagnosis not present

## 2016-03-26 DIAGNOSIS — IMO0001 Reserved for inherently not codable concepts without codable children: Secondary | ICD-10-CM

## 2016-03-26 MED ORDER — PRAMIPEXOLE DIHYDROCHLORIDE 0.25 MG PO TABS: 0.2500 mg | ORAL_TABLET | Freq: Every day | ORAL | Status: AC

## 2016-03-26 NOTE — Progress Notes (Signed)
NEUROLOGY FOLLOW UP OFFICE NOTE  Joe Palmer [id number]  HISTORY OF PRESENT ILLNESS: Joe Palmer is a 75 year old right-handed man with history of COPD, OSA, asthma, type II diabetes and history of left Bell's palsy who follows up for left-sided Bell's palsy and restless leg syndrome.    UPDATE: Restless Leg Syndrome:   He takes pramipexole 0.25mg  in the evening.  It is effective.  Left-sided Bell's palsy: Stable.  Still with mild residual symptoms.  He was recently on prednisone for COPD exacerbation, which elevated his serum glucose.  He is trying to optimize his glycemic control now.  HISTORY: Restless Leg Syndrome: For over a year, he has been experiencing pain in either leg.  It is a diffuse aching pain of the entire legs.  It is not a shooting radicular pain.  He does have mild mid lower back pain.  He denies numbness or tingling.  He does feel it during the day but it is most pronounced at night.  He will wake up feeling either leg with this discomfort.  He has an urge to move his legs and will get up to walk, which relieves it but does not resolve it.  His wife also notes that his leg will jerk in his sleep.  ABI was negative for peripheral vascular disease.  Ferritin level was 32.9.  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  Left-sided Bell's palsy: In August 2015, he woke up with left upper facial weakness.  Over the next 2 days, it progressed to severe left sided upper and lower facial weakness.  He also noted pain in the left ear, as well as taste abnormality.  Food and drink would spill out of his mouth.  There was no associated unilateral numbness or weakness of the body or headache.  No hyperacusis.  He denied any prior viral illness.  He took Valtrex and a prednisone taper.   He continued to have residual facial weakness  but did gradual improve a little.  However, in August 2016, he began noticing subtle worsening of symptoms, such as increased watering of the left eye and slight numbness and droop at the left side of his mouth.  Labs in September showed Hgb A1c of 7.2, ANA 1:40 with speckled pattern, Sed Rate 6, B12 391, ACE 90, and Lyme negative MRI of the brain with and without contrast on 07/02/15 showed symmetric enhancement of both seventh cranial nerves.  He underwent a lumbar puncture on 07/12/15, which showed CSF cell count of 1, glucose 92, protein 68, Lyme antibodies negative, greater than 5 oligoclonal bands, IgG 6.4, ACE 8, and negative gram stain and culture.  PAST MEDICAL HISTORY: Past Medical History  Diagnosis Date  . Diabetes mellitus without complication (HCC)   . Asthma   . Sleep apnea   . COPD (chronic obstructive pulmonary disease) (HCC)     MEDICATIONS: Current Outpatient Prescriptions on File Prior to Visit  Medication Sig Dispense Refill  . albuterol (PROVENTIL HFA;VENTOLIN HFA) 108 (90 BASE) MCG/ACT inhaler Inhale 2 puffs into the lungs every 6 (six) hours as needed for wheezing or shortness of breath.    Marland Kitchen albuterol (PROVENTIL) (2.5 MG/3ML) 0.083% nebulizer solution Inhale 3 mLs into the lungs every 6 (six) hours as needed for wheezing or shortness of breath.     Marland Kitchen  Blood Glucose Monitoring Suppl (ONE TOUCH ULTRA 2) W/DEVICE KIT     . cetirizine (ZYRTEC) 10 MG tablet Take 10 mg by mouth daily as needed for allergies.    Marland Kitchen doxycycline (VIBRA-TABS) 100 MG tablet Take 1 tablet (100 mg total) by mouth 2 (two) times daily. 14 tablet 0  . fluticasone (FLONASE) 50 MCG/ACT nasal spray Place 1 spray into both nostrils daily.    Marland Kitchen FREESTYLE LITE test strip     . ibuprofen (ADVIL,MOTRIN) 200 MG tablet Take 800 mg by mouth every 6 (six) hours as needed for moderate pain.    Marland Kitchen LANTUS SOLOSTAR 100 UNIT/ML Solostar Pen Inject 35 Units into the skin at bedtime.     Glory Rosebush DELICA LANCETS FINE MISC      . oxyCODONE-acetaminophen (PERCOCET/ROXICET) 5-325 MG per tablet Take 1 tablet by mouth every 6 (six) hours as needed.     Marland Kitchen SPIRIVA HANDIHALER 18 MCG inhalation capsule Place 1 capsule into inhaler and inhale daily.     No current facility-administered medications on file prior to visit.    ALLERGIES: Allergies  Allergen Reactions  . Sulfa Antibiotics Other (See Comments)    unknown    FAMILY HISTORY: Family History  Problem Relation Age of Onset  . Allergies Mother   . Diabetes Mother     SOCIAL HISTORY: Social History   Social History  . Marital Status: Married    Spouse Name: N/A  . Number of Children: 3  . Years of Education: N/A   Occupational History  . Not on file.   Social History Main Topics  . Smoking status: Former Smoker -- 1.00 packs/day for 40 years    Types: Cigarettes    Quit date: 09/15/2000  . Smokeless tobacco: Never Used  . Alcohol Use: 0.0 oz/week    0 Standard drinks or equivalent per week     Comment: socially  . Drug Use: No  . Sexual Activity:    Partners: Female   Other Topics Concern  . Not on file   Social History Narrative   Lives with wife in a one story home.  Retired from [employer].  Has 3 children.      REVIEW OF SYSTEMS: Constitutional: No fevers, chills, or sweats, no generalized fatigue, change in appetite Eyes: No visual changes, double vision, eye pain Ear, nose and throat: No hearing loss, ear pain, nasal congestion, sore throat Cardiovascular: No chest pain, palpitations Respiratory:  No shortness of breath at rest or with exertion, wheezes GastrointestinaI: No nausea, vomiting, diarrhea, abdominal pain, fecal incontinence Genitourinary:  No dysuria, urinary retention or frequency Musculoskeletal:  No neck pain, back pain Integumentary: No rash, pruritus, skin lesions Neurological: as above Psychiatric: No depression, insomnia, anxiety Endocrine: No palpitations, fatigue, diaphoresis, mood swings,  change in appetite, change in weight, increased thirst Hematologic/Lymphatic:  No purpura, petechiae. Allergic/Immunologic: no itchy/runny eyes, nasal congestion, recent allergic reactions, rashes  PHYSICAL EXAM: Filed Vitals:   03/26/16 0914  BP: 142/64  Pulse: 72   General: No acute distress.  Patient appears well-groomed.  obese body habitus. Head:  Normocephalic/atraumatic Eyes:  Fundi examined but not visualized Neck: supple, no paraspinal tenderness, full range of motion Heart:  Regular rate and rhythm Lungs:  Clear to auscultation bilaterally Back: No paraspinal tenderness Neurological Exam: alert and oriented to person, place, and time. Attention span and concentration intact, recent and remote memory intact, fund of knowledge intact.  Speech fluent and not dysarthric, language intact.  Mild left  sided LMN weakness.  Otherwise, CN II-XII intact. Bulk and tone normal, left foot drop, otherwise muscle strength 5/5 throughout.  Light touch sensation intact.  Deep tendon reflexes 1+ throughout.  Finger to nose testing intact.  Steppage with left foot.   IMPRESSION: Restless leg syndrome stable Left-sided Bell's palsy stable with residual symptoms. Elevated blood pressure  PLAN: 1.  Continue pramipexole 0.'25mg'$  at bedtime 2.  BP borderline elevated.  Should follow up with PCP for recheck. 3.  Follow up in 6 months.  15 minutes spent face to face with patient, over 50% spent discussing management and diagnosis.  Metta Clines, DO  CC:  Kelton Pillar, MD

## 2016-03-26 NOTE — Progress Notes (Signed)
Chart forwarded.  

## 2016-03-26 NOTE — Patient Instructions (Signed)
1.  Continue pramipexole, total of 0.25mg  in the evening (about 2 hours prior to bedtime) 2.  Follow up in 6 months.

## 2016-03-31 ENCOUNTER — Ambulatory Visit: Payer: Medicare Other | Admitting: Acute Care

## 2016-04-21 ENCOUNTER — Ambulatory Visit: Payer: Medicare Other | Admitting: Acute Care

## 2016-04-30 ENCOUNTER — Encounter: Payer: Self-pay | Admitting: Adult Health

## 2016-04-30 ENCOUNTER — Ambulatory Visit (INDEPENDENT_AMBULATORY_CARE_PROVIDER_SITE_OTHER): Payer: Medicare Other | Admitting: Adult Health

## 2016-04-30 DIAGNOSIS — J449 Chronic obstructive pulmonary disease, unspecified: Secondary | ICD-10-CM | POA: Diagnosis not present

## 2016-04-30 DIAGNOSIS — R030 Elevated blood-pressure reading, without diagnosis of hypertension: Secondary | ICD-10-CM

## 2016-04-30 DIAGNOSIS — IMO0001 Reserved for inherently not codable concepts without codable children: Secondary | ICD-10-CM | POA: Insufficient documentation

## 2016-04-30 NOTE — Assessment & Plan Note (Signed)
Recent flare, now resolved  Plan  Patient Instructions  Continue on Advair and Spiriva, rinse after use. Follow up with Dr. Maple HudsonYoung in 4-6 months and As needed

## 2016-04-30 NOTE — Assessment & Plan Note (Signed)
Advised b/p is elevated today  Needs follow up with PCP to discuss b/p . Pt verbalizes understanding.

## 2016-04-30 NOTE — Progress Notes (Signed)
History of Present Illness Joe Palmer is a 75 y.o. male  75 yo male former smoker with moderate COPD  Hx of OSA -not on CPAP (previously on CPAP -lost 25lb stopped wearing it)   04/30/2016  Follow up : COPD  Patient returns for a 6 week follow-up for COPD. She was seen last visit with a COPD exacerbation. He was treated with a prednisone taper. Chest x-ray last visit showed chronic interstitial changes.  Since last visit. Patient is feeling better. Still gets winded with activity. Has some congestion most days.  Wheezing and tightness has resolved. Does not like hot humid weather.  Patient remains on Spiriva and Advair. Pneumovax and Prevnar vaccines are up-to-date.  Tests:  CXR 03/03/2016: Stable cardiomegaly. Stable chronic interstitial changes consistent with chronic interstitial lung disease. No acute cardiopulmonary disease identified  PFT-03-2014: Moderately severe obstructive airways disease with slight response to bronchodilator, moderately severe restriction and severe diffusion defect. FVC 2.59/61%, FEV1 1.94/63%, FEV1/FVC 0.75, TLC 65%, DLCO 47%   Past medical hx Past Medical History:  Diagnosis Date  . Asthma   . COPD (chronic obstructive pulmonary disease) ([place])   . Diabetes mellitus without complication ([place])   . Sleep apnea      Past surgical hx, Family hx, Social hx all reviewed.  Current Outpatient Prescriptions on File Prior to Visit  Medication Sig  . albuterol (PROVENTIL HFA;VENTOLIN HFA) 108 (90 BASE) MCG/ACT inhaler Inhale 2 puffs into the lungs every 6 (six) hours as needed for wheezing or shortness of breath.  Marland Kitchen albuterol (PROVENTIL) (2.5 MG/3ML) 0.083% nebulizer solution Inhale 3 mLs into the lungs every 6 (six) hours as needed for wheezing or shortness of breath.   . Blood Glucose Monitoring Suppl (ONE TOUCH ULTRA 2) W/DEVICE KIT   . cetirizine (ZYRTEC) 10 MG tablet Take 10 mg by mouth daily as needed for allergies.  Marland Kitchen doxycycline (VIBRA-TABS)  100 MG tablet Take 1 tablet (100 mg total) by mouth 2 (two) times daily.  . fluticasone (FLONASE) 50 MCG/ACT nasal spray Place 1 spray into both nostrils daily.  Marland Kitchen FREESTYLE LITE test strip   . ibuprofen (ADVIL,MOTRIN) 200 MG tablet Take 800 mg by mouth every 6 (six) hours as needed for moderate pain.  Marland Kitchen LANTUS SOLOSTAR 100 UNIT/ML Solostar Pen Inject 35 Units into the skin at bedtime.   Glory Rosebush DELICA LANCETS FINE MISC   . oxyCODONE-acetaminophen (PERCOCET/ROXICET) 5-325 MG per tablet Take 1 tablet by mouth every 6 (six) hours as needed.   . pramipexole (MIRAPEX) 0.25 MG tablet Take 1 tablet (0.25 mg total) by mouth at bedtime.  Marland Kitchen SPIRIVA HANDIHALER 18 MCG inhalation capsule Place 1 capsule into inhaler and inhale daily.   No current facility-administered medications on file prior to visit.      Allergies  Allergen Reactions  . Sulfa Antibiotics Other (See Comments)    unknown    Review Of Systems:  Constitutional:   No  weight loss, night sweats,  Fevers, chills, fatigue, or  lassitude.  HEENT:   No headaches,  Difficulty swallowing,  Tooth/dental problems, or  Sore throat,                No sneezing, itching, ear ache, nasal congestion, post nasal drip,   CV:  No chest pain,  Orthopnea, PND, +swelling in lower extremities ( at baseline), no  anasarca, dizziness, palpitations, syncope.   GI  No heartburn, indigestion, abdominal pain, nausea, vomiting, diarrhea, change in bowel habits, loss of appetite,  bloody stools.   Resp: + shortness of breath with exertion not at rest.  No excess mucus, no productive cough,  No non-productive cough,  No coughing up of blood.  No change in color of mucus.  + wheezing.  No chest wall deformity  Skin: no rash or lesions.  GU: no dysuria, change in color of urine, no urgency or frequency.  No flank pain, no hematuria   MS:  No joint pain or swelling.  No decreased range of motion.  No back pain.  Psych:  No change in mood or affect. No  depression or anxiety.  No memory loss.   Vital Signs There were no vitals taken for this visit.   Physical Exam: Vitals:   04/30/16 1045  Pulse: 80  Temp: 97.6 F (36.4 C)  TempSrc: Oral  SpO2: 90%  Weight: 259 lb (117.5 kg)  Height: 6' (1.829 m)   GEN: A/Ox3; pleasant , NAD, well nourished    HEENT:  [place]/AT,  EACs-clear, TMs-wnl, NOSE-clear, THROAT-clear, no lesions, no postnasal drip or exudate noted.   NECK:  Supple w/ fair ROM; no JVD; normal carotid impulses w/o bruits; no thyromegaly or nodules palpated; no lymphadenopathy.    RESP  Clear  P & A; w/o, wheezes/ rales/ or rhonchi. no accessory muscle use, no dullness to percussion  CARD:  RRR, no m/r/g  , no peripheral edema, pulses intact, no cyanosis or clubbing.  GI:   Soft & nt; nml bowel sounds; no organomegaly or masses detected.   Musco: Warm bil, no deformities or joint swelling noted.   Neuro: alert, no focal deficits noted.    Skin: Warm, no lesions or rashes     Rexene Edison, NP 04/30/2016  10:42 AM

## 2016-04-30 NOTE — Patient Instructions (Signed)
Continue on Advair and Spiriva, rinse after use. Follow up with Dr. Maple HudsonYoung in 4-6 months and As needed

## 2016-05-23 ENCOUNTER — Ambulatory Visit: Payer: Medicare Other | Admitting: Podiatry

## 2016-05-30 ENCOUNTER — Encounter: Payer: Self-pay | Admitting: Podiatry

## 2016-05-30 ENCOUNTER — Ambulatory Visit (INDEPENDENT_AMBULATORY_CARE_PROVIDER_SITE_OTHER): Payer: Medicare Other | Admitting: Podiatry

## 2016-05-30 DIAGNOSIS — B351 Tinea unguium: Secondary | ICD-10-CM

## 2016-05-30 DIAGNOSIS — E1151 Type 2 diabetes mellitus with diabetic peripheral angiopathy without gangrene: Secondary | ICD-10-CM

## 2016-05-30 DIAGNOSIS — M79676 Pain in unspecified toe(s): Secondary | ICD-10-CM

## 2016-05-30 DIAGNOSIS — Q828 Other specified congenital malformations of skin: Secondary | ICD-10-CM

## 2016-05-30 NOTE — Progress Notes (Signed)
Subjective:     Patient ID: Joe Palmer, male   DOB: [birth year], 75 y.o.   MRN: [id number]  HPI this patient presents the office with chief complaint of long thick painful nails. He also relates that he has painful calluses on the bottom of his right foot. Patient states that this is painful as he walks and wears his shoes. . Patient is type II diabetic, presents the office for an evaluation and treatment of his feet   Review of Systems     Objective:   Physical Exam GENERAL APPEARANCE: Alert, conversant. Appropriately groomed. No acute distress.  VASCULAR: Pedal pulses are  palpable at  [facility] and PT bilateral.  Capillary refill time is immediate to all digits,  Normal temperature gradient.  Digital hair growth is present bilateral  NEUROLOGIC: sensation is diminished  to 5.07 monofilament at 5/5 sites bilateral.  Light touch is intact bilateral, Muscle strength normal.  MUSCULOSKELETAL: acceptable muscle strength, tone and stability bilateral.  Intrinsic muscluature intact bilateral.  Rectus appearance of foot and digits noted bilateral. Hammer toes 2-4 right with plantarflexed metatarsals right foot.  DERMATOLOGIC: skin color, texture, and turgor are within normal limits.  No preulcerative lesions or ulcers  are seen, no interdigital maceration noted.  No open lesions present.  Digital nails are asymptomatic. No drainage noted.Porokeratosis sub 5 left, sub 1,3 right.      Assessment:     Onychomycosis  Porokeratosis sub 5 left foot Porokeratosis sub1,3 right    Plan:     Debridement of nails. Debridement of plantar keratosis right foot.  RTC 3 months   Helane GuntherGregory Achol Azpeitia DPM

## 2016-06-26 IMAGING — XA DG FLUORO GUIDE LUMBAR PUNCTURE
1 series · 1 of 1 positions shown · non-contrast
Comparison: none

CLINICAL DATA: Instability

[Series 3: ortho standard · 1 of 1 slices shown]
[im 1/1]
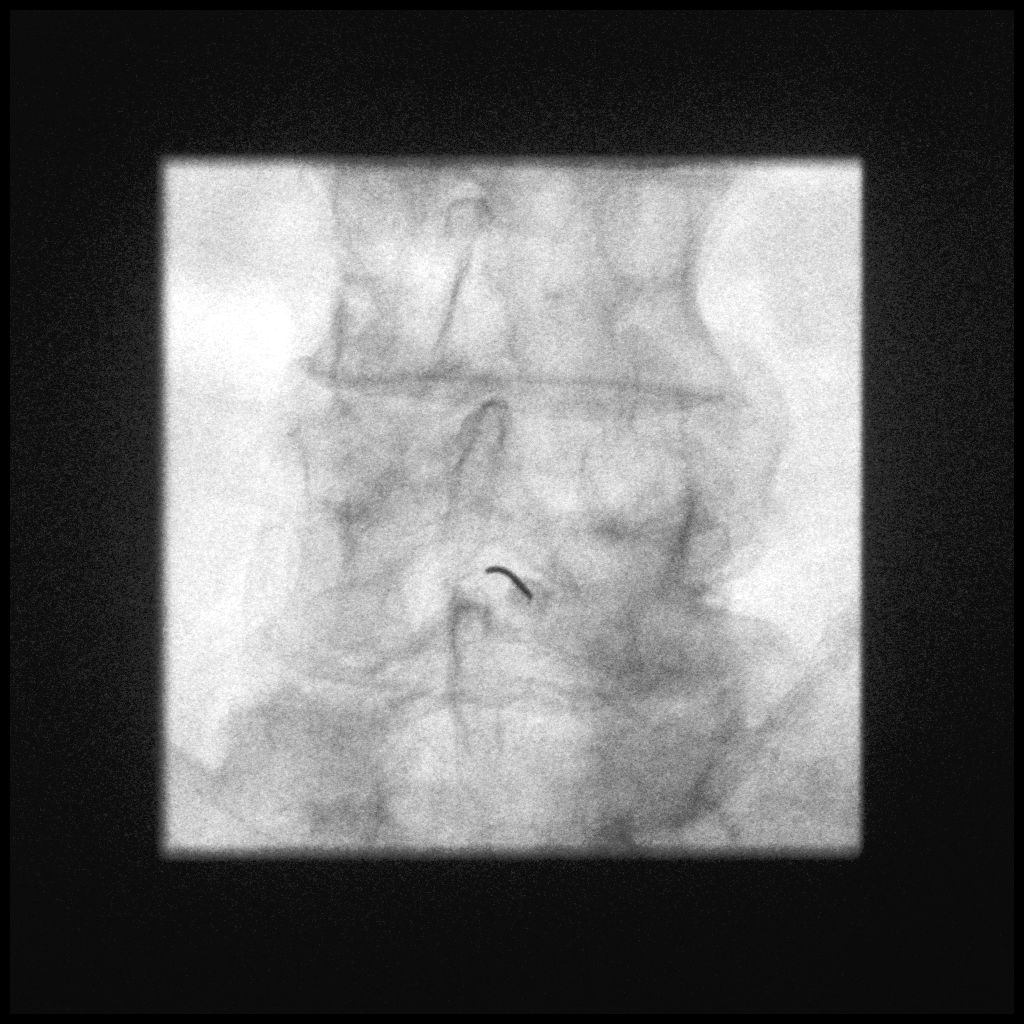

[1 of 1 positions shown; findings below may reference images not displayed]

EXAM:
DIAGNOSTIC LUMBAR PUNCTURE UNDER FLUOROSCOPIC GUIDANCE

FLUOROSCOPY TIME:  1 minutes and 4 seconds

PROCEDURE:
Informed consent was obtained from the patient prior to the
procedure, including potential complications of headache, allergy,
and pain. With the patient prone, the lower back was prepped with
Betadine. 1% Lidocaine was used for local anesthesia. Lumbar
puncture was performed initially at the right L2-3 level using a 20
gauge needle with return of clear CSF with an opening pressure of 15
cm water. Oneml of CSF were obtained for laboratory studies. A
second puncture was then performed on the right at the L4-5 level,
again yielding clear CSF, an yielding 8 cc of fluid. The patient
tolerated the procedure well and there were no apparent
complications.
IMPRESSION: Successful lumbar puncture at L2-3 and L4-5 for adequate fluid
sampling.

## 2016-06-26 IMAGING — XA DG FLUORO GUIDE LUMBAR PUNCTURE
1 series · 1 of 1 positions shown · non-contrast
Comparison: none

CLINICAL DATA: Instability

[Series 1: ortho standard · 1 of 1 slices shown]
[im 1/1]
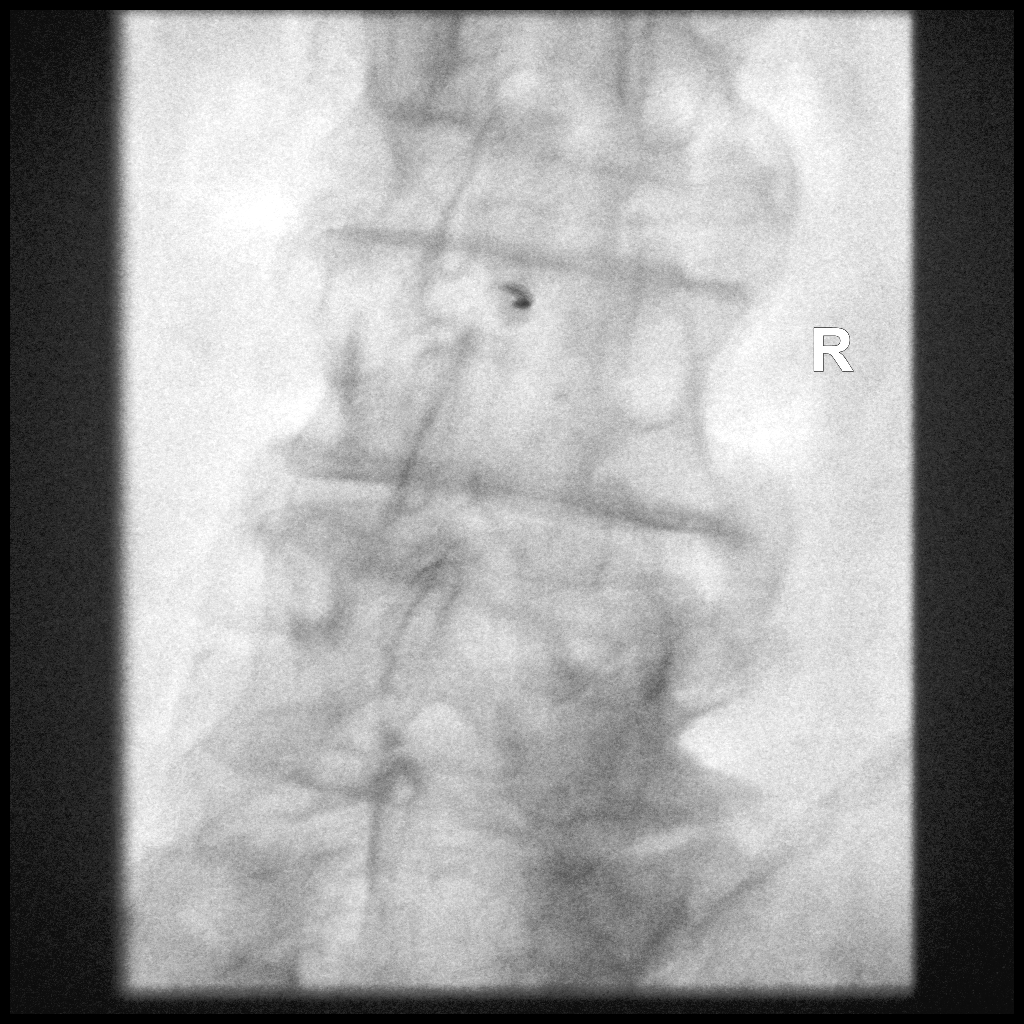

[1 of 1 positions shown; findings below may reference images not displayed]

EXAM:
DIAGNOSTIC LUMBAR PUNCTURE UNDER FLUOROSCOPIC GUIDANCE

FLUOROSCOPY TIME:  1 minutes and 4 seconds

PROCEDURE:
Informed consent was obtained from the patient prior to the
procedure, including potential complications of headache, allergy,
and pain. With the patient prone, the lower back was prepped with
Betadine. 1% Lidocaine was used for local anesthesia. Lumbar
puncture was performed initially at the right L2-3 level using a 20
gauge needle with return of clear CSF with an opening pressure of 15
cm water. Oneml of CSF were obtained for laboratory studies. A
second puncture was then performed on the right at the L4-5 level,
again yielding clear CSF, an yielding 8 cc of fluid. The patient
tolerated the procedure well and there were no apparent
complications.
IMPRESSION: Successful lumbar puncture at L2-3 and L4-5 for adequate fluid
sampling.

## 2016-07-16 DEATH — deceased

## 2016-08-29 ENCOUNTER — Ambulatory Visit: Payer: Medicare Other | Admitting: Podiatry

## 2016-09-26 ENCOUNTER — Ambulatory Visit: Payer: Medicare Other | Admitting: Neurology

## 2017-02-16 IMAGING — DX DG CHEST 2V
2 series · 2 of 2 positions shown · non-contrast
Comparison: 12/05/2013.  06/07/2013 .

CLINICAL DATA: Wheezing.  Shortness of breath .

EXAM:
CHEST  2 VIEW

[chest pa]
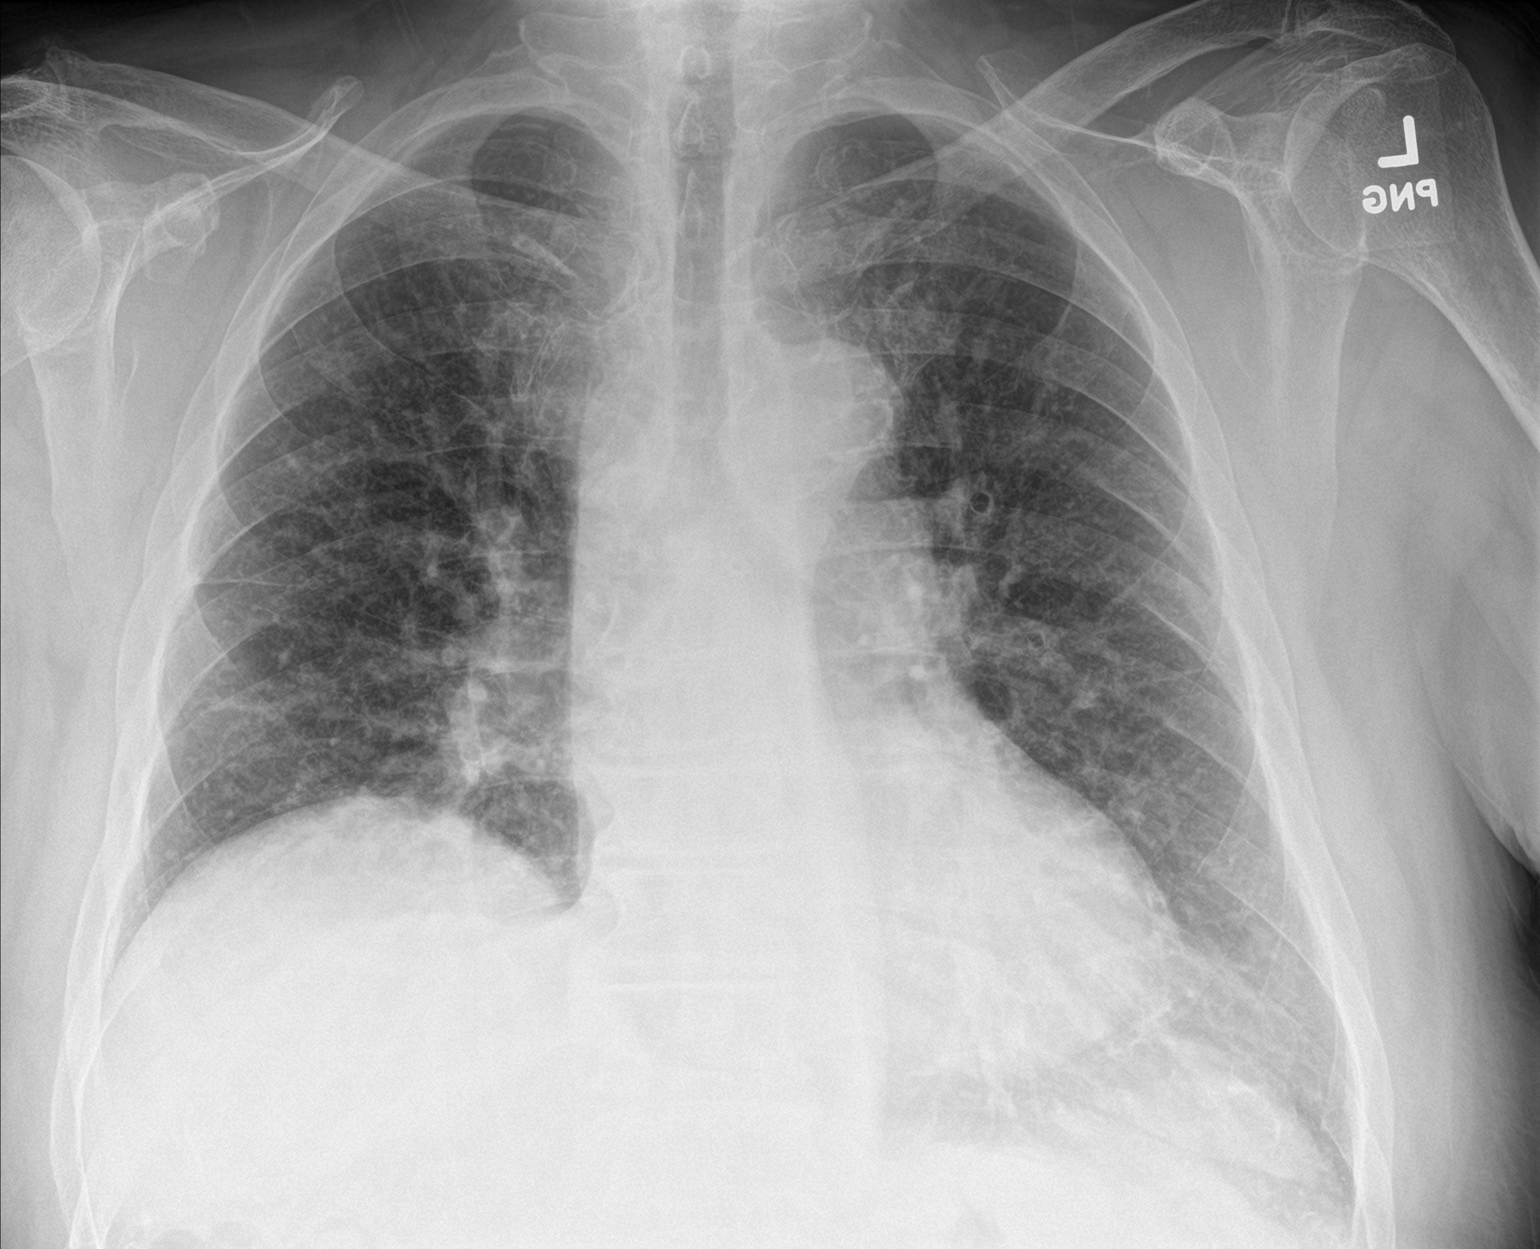

[chest lat]
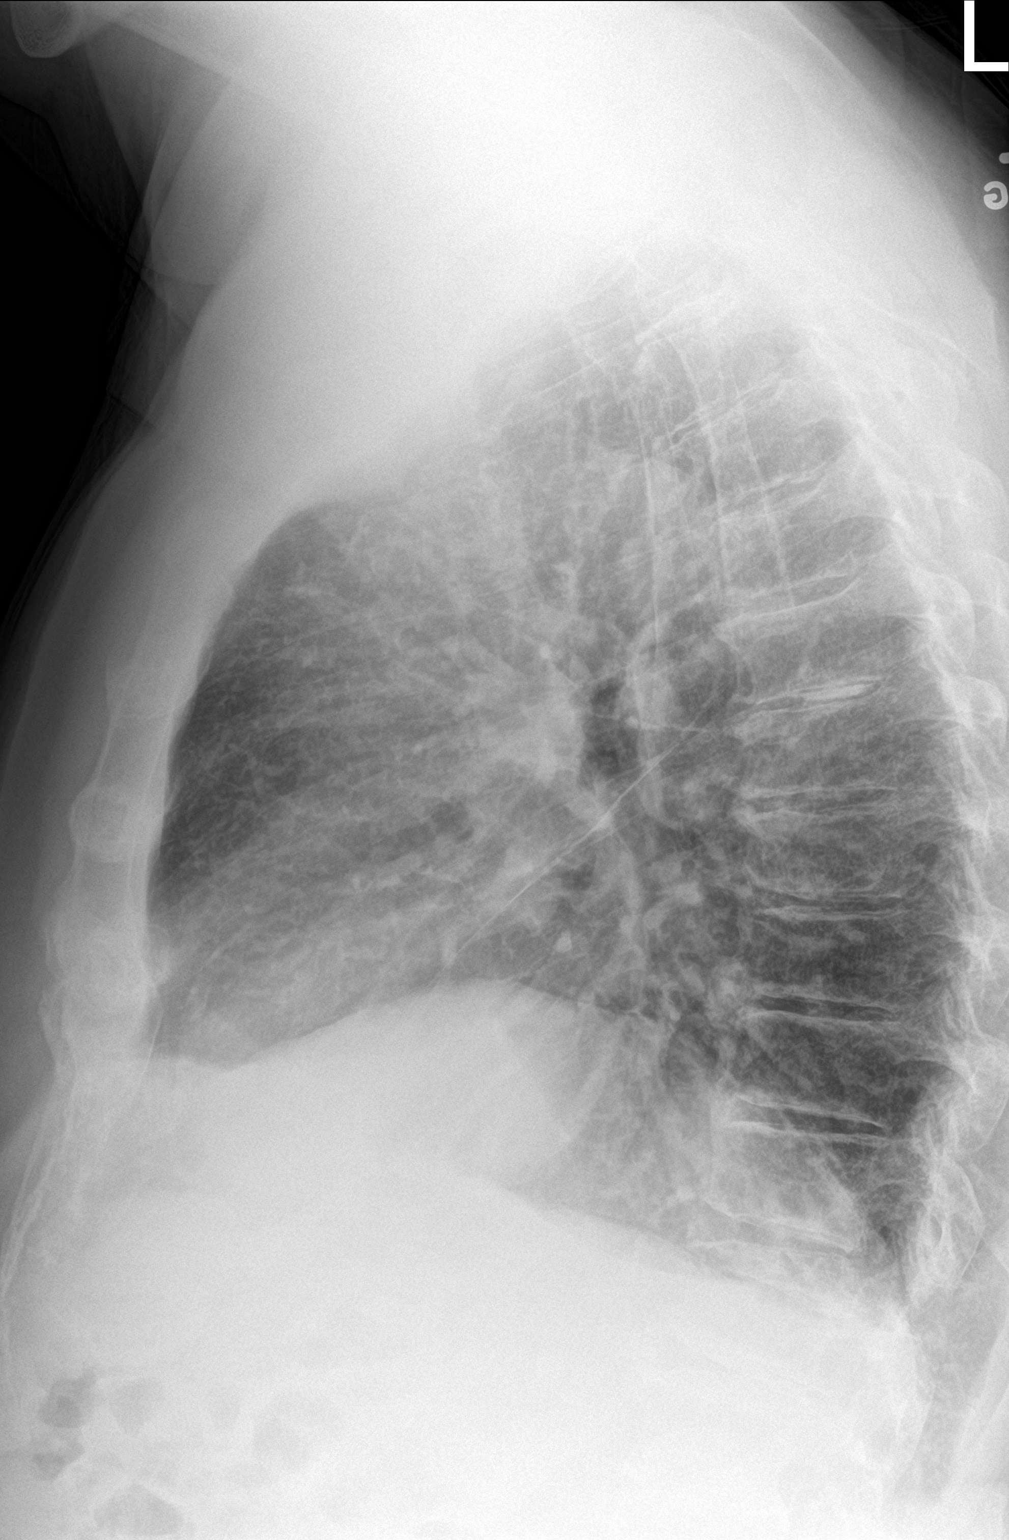

[2 of 2 positions shown; findings below may reference images not displayed]

FINDINGS: Mediastinum and hilar structures normal stable cardiomegaly. Stable
interstitial changes consistent chronic interstitial disease. No
focal infiltrate. No pleural effusion or pneumothorax.
IMPRESSION: 1.  Stable cardiomegaly.

2. Stable chronic interstitial changes consistent with chronic
interstitial lung disease. No acute cardiopulmonary disease
identified.
